# Patient Record
Sex: Male | Born: 1995 | Race: Black or African American | Hispanic: No | Marital: Single | Smoking: Light tobacco smoker
Health system: Southern US, Community
[De-identification: ages and names within clinical notes are randomized; demographics above are authoritative.]

---
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
## 2013-01-26 ENCOUNTER — Emergency Department: Payer: Self-pay | Admitting: Emergency Medicine

## 2013-03-10 ENCOUNTER — Emergency Department: Payer: Self-pay | Admitting: Emergency Medicine

## 2013-09-10 ENCOUNTER — Emergency Department: Payer: Self-pay | Admitting: Emergency Medicine

## 2013-10-24 ENCOUNTER — Inpatient Hospital Stay: Payer: Self-pay | Admitting: Psychiatry

## 2013-10-24 LAB — DRUG SCREEN, URINE
AMPHETAMINES, UR SCREEN: NEGATIVE (ref ?–1000)
Barbiturates, Ur Screen: NEGATIVE (ref ?–200)
Benzodiazepine, Ur Scrn: NEGATIVE (ref ?–200)
CANNABINOID 50 NG, UR ~~LOC~~: POSITIVE (ref ?–50)
Cocaine Metabolite,Ur ~~LOC~~: NEGATIVE (ref ?–300)
MDMA (Ecstasy)Ur Screen: NEGATIVE (ref ?–500)
METHADONE, UR SCREEN: NEGATIVE (ref ?–300)
Opiate, Ur Screen: NEGATIVE (ref ?–300)
Phencyclidine (PCP) Ur S: NEGATIVE (ref ?–25)
TRICYCLIC, UR SCREEN: NEGATIVE (ref ?–1000)

## 2013-10-24 LAB — COMPREHENSIVE METABOLIC PANEL
AST: 30 U/L (ref 10–41)
Albumin: 4.6 g/dL (ref 3.8–5.6)
Alkaline Phosphatase: 76 U/L
Anion Gap: 4 — ABNORMAL LOW (ref 7–16)
BUN: 9 mg/dL (ref 7–18)
Bilirubin,Total: 0.4 mg/dL (ref 0.2–1.0)
Calcium, Total: 8.9 mg/dL — ABNORMAL LOW (ref 9.0–10.7)
Chloride: 104 mmol/L (ref 98–107)
Co2: 28 mmol/L (ref 21–32)
Creatinine: 0.84 mg/dL (ref 0.60–1.30)
EGFR (Non-African Amer.): >60
Glucose: 85 mg/dL (ref 65–99)
OSMOLALITY: 270 (ref 275–301)
POTASSIUM: 3.3 mmol/L — AB (ref 3.5–5.1)
SGPT (ALT): 19 U/L (ref 12–78)
Sodium: 136 mmol/L (ref 136–145)
Total Protein: 7.8 g/dL (ref 6.4–8.6)

## 2013-10-24 LAB — CBC
HCT: 46.2 % (ref 40.0–52.0)
HGB: 14.7 g/dL (ref 13.0–18.0)
MCH: 27 pg (ref 26.0–34.0)
MCHC: 31.9 g/dL — AB (ref 32.0–36.0)
MCV: 85 fL (ref 80–100)
Platelet: 251 10*3/uL (ref 150–440)
RBC: 5.46 10*6/uL (ref 4.40–5.90)
RDW: 13.5 % (ref 11.5–14.5)
WBC: 7.2 10*3/uL (ref 3.8–10.6)

## 2013-10-24 LAB — ETHANOL
Ethanol %: <0.003 % (ref 0.000–0.080)
Ethanol: <3 mg/dL

## 2013-10-24 LAB — URINALYSIS, COMPLETE
Bacteria: NONE SEEN
Bilirubin,UR: NEGATIVE
Blood: NEGATIVE
GLUCOSE, UR: NEGATIVE mg/dL (ref 0–75)
Ketone: NEGATIVE
LEUKOCYTE ESTERASE: NEGATIVE
Nitrite: NEGATIVE
Ph: 7 (ref 4.5–8.0)
Protein: NEGATIVE
RBC,UR: 2 /HPF (ref 0–5)
SPECIFIC GRAVITY: 1.018 (ref 1.003–1.030)
Squamous Epithelial: NONE SEEN
WBC UR: <1 /HPF (ref 0–5)

## 2013-10-24 LAB — ACETAMINOPHEN LEVEL

## 2013-10-24 LAB — SALICYLATE LEVEL

## 2013-12-04 LAB — HM HIV SCREENING LAB: HM HIV Screening: NEGATIVE

## 2014-10-07 ENCOUNTER — Emergency Department: Payer: Self-pay | Admitting: Emergency Medicine

## 2014-11-27 NOTE — H&P (Signed)
PATIENT NAME:  Jerry Rose, Jerry Rose MR#:  [id number] DATE OF BIRTH:  19-Mar-1996  DATE OF ADMISSION:  10/24/2013  PLACE OF DICTATION:  [facility], [place].  AGE:  19 years.  SEX:  Male.  RACE:  African American.  INITIAL PSYCHIATRIC EVALUATION  IDENTIFYING INFORMATION:  The patient is a 19 year old African American male who lives with his cousin who is in his 6750's..  The patient is getting ready to go back to school in the 11th grade as he has been held back and will be going to [employer] and likes it.  The patient comes for first inpatient psychiatry at [facility] with a chief complaint "My mom, she don't care, nobody cares."  HISTORY OF PRESENT ILLNESS:  The patient reports that he and his mother always get into conflicts and arguments and this time he stated, "What would you do if I killed myself and died?"  Mother took it seriously and called for help and he was brought here on [facility].  The patient reports that mother is married and lives with step-father and the patient and step-father do not get along with each other.    PAST PSYCHIATRIC HISTORY:  No previous history of inpatient psychiatry.  No history of suicide attempt, not being followed by any psychiatrist.    FAMILY HISTORY OF MENTAL ILLNESS:  Unknown but no known history of suicide in the family.   FAMILY HISTORY:  Raised by mother and he has not seen his father in many years.  Father left the family when the patient was very young.  Mother works as a Associate Professorcosmetologist.  Producer, television/film/videotep-father cuts hair and the patient does not get along with him.  PERSONAL HISTORY:  [place], [place], was kicked out of school because of trouble with a group of other people.  Currently he likes his [employer] and is making good grades.    WORK:  He has never worked so far.  MILITARY HISTORY:  Nothing applicable.  ALCOHOL AND DRUGS:  Denies drinking alcohol.  Smokes marijuana on a few occasions.   Denies any other street or prescription drug abuse.  Denies smoking nicotine cigarettes.  PAST MEDICAL HISTORY:  No high blood pressure.  No known diabetes mellitus.  Status post tonsillectomy, adenoidectomy - remote.  No major illnesses.  No history of motor vehicle accident or being unconsciousness.  ALLERGIES:  PENICILLIN.  The patient is being followed by Dr. York CeriseForbach, last appointment quite some time ago.  Next appointment is to be made for routine checkup.    PHYSICAL EXAMINATION:  VITAL SIGNS:  Temperature is 98.2, pulse is 64 per minute, regular, respirations 20 per minute, regular.  Blood pressure 122/80 mmHg. HEENT:  Head is normocephalic, atraumatic.  Eyes: PERRLA.  Fundi bilaterally benign. EOMs full and tympanic membranes with no exudates. NECK:  Supple without any lymphadenopathy or thyromegaly. CHEST:  Normal expansion.  Normal breath sounds heard. HEART:  Normal S1 without any murmurs or gallops. ABDOMEN:  Soft, no organomegaly.  Bowel sounds heard.  RECTAL:  Deferred. NEUROLOGIC:  Gait is normal.  Romberg is negative..  Cranial nerves II through XII intact, normal.  DTRs 2+, normal.  Plantars normal.    MENTAL STATUS EXAMINATION:  The patient is dressed in street clothes, alert and oriented.  Fully aware of situation that brought him for admission to [facility].  Affect is appropriate with his mood which is irritable and angry and upset about the entire situation at home and  his arguments which are constant with his mother.  Staff reports that the patient has poor impulse control and wanted to punch holes in the wall and had to be re-directed and calmed down.  No psychosis.  Denies auditory or visual hallucinations.  No paranoid thinking.  Memory is intact.  Cognition is intact.  Could spell the word world forward and backward without any problem.  Could count money.  He did not know the capital of [place], but he knew the capital of the [place] and name of the current  Economist.  Denies any appetite or sleep disturbance.  Insight and judgment guarded.   IMPRESSION:   AXIS I:  Impulse control disorder. AXIS II:  Deferred. AXIS III:  None major. AXIS IV:  Constant, moderate; constant conflicts with mother and step-father, not able to live with them. AXIS V:  Global assessment of functioning 30.  PLAN:  The patient admitted to [facility] for close observation and therapy.  He will be started on antidepressant medication such as Effexor XR 75 mg p.o. daily which will help him calm down and help him with his anxiety and irritability.  During his stay in the hospital he will be given milieu therapy and supportive counseling.  He will take part in individual and group therapy, where coping skills and getting along with the people around will be addressed. At the time of discharge, the patient will be stabilized and appropriate followup appointments will be made in the community and the patient and mother may need some counseling sessions for better understanding of each other.   ____________________________ Jannet Mantis. Guss Bunde, MD skc:ea D: 10/25/2013 00:47:07 ET T: 10/25/2013 02:24:04 ET JOB#: [id number]  cc: Monika Salk K. Guss Bunde, MD, <Dictator> Beau Fanny MD ELECTRONICALLY SIGNED 10/25/2013 23:28

## 2014-11-27 NOTE — Discharge Summary (Signed)
PATIENT NAME:  Jerry Rose, Jerry Rose MR#:  [id number] DATE OF BIRTH:  July 05, 1996  DATE OF ADMISSION:  10/24/2013 DATE OF DISCHARGE:  10/26/2013  HOSPITAL COURSE: See dictated history and physical for details of admission. A 19 year old man brought to the hospital after his mother called EMS because the patient had made a suicidal statement. In the hospital, the patient admitted that he had made such a statement, but denied having any actual suicidal thoughts or plans. He had not made any attempt to harm himself. He has been consistent about not having suicidal thoughts since being in the hospital. The patient was started on venlafaxine by the admitting physician. He engaged in groups and activities on the unit. He met with his family and says that he feels like his relationship with them is improving. At the time of discharge, the patient said his mood is feeling fine. He feels more confident about the future. He is agreeable and receptive to continuing venlafaxine at the current dose. He has positive plans for the future involving going back to school and helping to take care of his young son. At this point, the patient does not seem to be acutely dangerous and can be referred for outpatient treatment in the community.   DISCHARGE MEDICATIONS: Venlafaxine 75 mg extended-release once a day.   MENTAL STATUS EXAM AT DISCHARGE: Neatly dressed and groomed young man who looks his stated age. Cooperative with the interview. Good eye contact. Normal psychomotor activity. Speech normal rate, tone and volume. Affect euthymic, reactive, appropriate. Mood stated as okay. Thoughts lucid. No loosening of associations. Denies auditory or visual hallucinations. Denies any delusions. Short and long-term memory intact. Normal fund of knowledge. Alert and oriented x4.   LABORATORY RESULTS: Labs on admission showed an alcohol level negative. Chemistry panel showed potassium 3.3, calcium 8.9, everything else on the chemistry panel  normal. Drug screen positive for cannabis. CBC and urinalysis unremarkable.   DISPOSITION: Discharge home. He lives with his cousin. He will follow up with outpatient providers as arranged by social work.   DIAGNOSIS, PRINCIPAL AND PRIMARY:  AXIS I: Adjustment disorder with depressed mood.   SECONDARY DIAGNOSES:     AXIS I: No further.  AXIS II: No diagnosis.  AXIS III: No diagnosis.  AXIS IV: Moderate from multiple stresses including being out of work, not having gone back to school, and conflict with his family.  AXIS V: Functioning at time of discharge 60.    ____________________________ Gonzella Lex, MD jtc:by D: 10/26/2013 16:50:40 ET T: 10/26/2013 20:53:10 ET JOB#: [id number]  cc: Gonzella Lex, MD, <Dictator> Gonzella Lex MD ELECTRONICALLY SIGNED 10/26/2013 22:29

## 2015-10-16 ENCOUNTER — Encounter: Payer: Self-pay | Admitting: Emergency Medicine

## 2015-10-16 ENCOUNTER — Emergency Department
Admission: EM | Admit: 2015-10-16 | Discharge: 2015-10-16 | Disposition: A | Discharge status: Home / Self Care | Payer: Self-pay | Attending: Emergency Medicine | Admitting: Emergency Medicine

## 2015-10-16 DIAGNOSIS — L03114 Cellulitis of left upper limb: Secondary | ICD-10-CM | POA: Insufficient documentation

## 2015-10-16 MED ORDER — IBUPROFEN 600 MG PO TABS: 600.0000 mg | ORAL_TABLET | Freq: Three times a day (TID) | ORAL | Status: DC | PRN

## 2015-10-16 MED ORDER — SULFAMETHOXAZOLE-TRIMETHOPRIM 800-160 MG PO TABS: 1.0000 | ORAL_TABLET | Freq: Two times a day (BID) | ORAL | Status: DC

## 2015-10-16 NOTE — ED Notes (Signed)
Pt c/o swelling at site of bug bite on L arm.  Pt c/o itching.  Slight swelling noted at L elbow

## 2015-10-16 NOTE — Discharge Instructions (Signed)
Cellulitis Cellulitis is an infection of the skin and the tissue under the skin. The infected area is usually red and tender. This happens most often in the arms and lower legs. HOME CARE   Take your antibiotic medicine as told. Finish the medicine even if you start to feel better.  Keep the infected arm or leg raised (elevated).  Put a warm cloth on the area up to 4 times per day.  Only take medicines as told by your doctor.  Keep all doctor visits as told. GET HELP IF:  You see red streaks on the skin coming from the infected area.  Your red area gets bigger or turns a dark color.  Your bone or joint under the infected area is painful after the skin heals.  Your infection comes back in the same area or different area.  You have a puffy (swollen) bump in the infected area.  You have new symptoms.  You have a fever. GET HELP RIGHT AWAY IF:   You feel very sleepy.  You throw up (vomit) or have watery poop (diarrhea).  You feel sick and have muscle aches and pains.   This information is not intended to replace advice given to you by your health care provider. Make sure you discuss any questions you have with your health care provider.   Document Released: 01/09/2008 Document Revised: 04/13/2015 Document Reviewed: 10/08/2011 Elsevier Interactive Patient Education 2016 Elsevier Inc[employer]   Apply warm moist compresses to the forearm several times per day. Begin taking antibiotics and start ibuprofen for inflammation and pain. [facility] if any continued problems.

## 2015-10-16 NOTE — ED Provider Notes (Signed)
[facility] Emergency Department Provider Note  ____________________________________________  Time seen: Approximately 5:17 PM  I have reviewed the triage vital signs and the nursing notes.   HISTORY  Chief Complaint Cellulitis   HPI Jerry Rose is a 20 y.o. male is here with complaint of redness and swelling that he noticed on his left forearm yesterday. Patient states that this area has gotten larger today and is more tender. He is unaware of any fever or chills. He is not having any history of MRSA in the past. Patient denies any injury to his arm or any insect bites. He rates his pain as a 6/10.   History reviewed. No pertinent past medical history.  There are no active problems to display for this patient.   History reviewed. No pertinent past surgical history.  Current Outpatient Rx  Name  Route  Sig  Dispense  Refill  . ibuprofen (ADVIL,MOTRIN) 600 MG tablet   Oral   Take 1 tablet (600 mg total) by mouth every 8 (eight) hours as needed.   30 tablet   0   . sulfamethoxazole-trimethoprim (BACTRIM DS,SEPTRA DS) 800-160 MG tablet   Oral   Take 1 tablet by mouth 2 (two) times daily.   20 tablet   0     Allergies Review of patient's allergies indicates no known allergies.  No family history on file.  Social History Social History  Substance Use Topics  . Smoking status: Never Smoker   . Smokeless tobacco: None  . Alcohol Use: No    Review of Systems Constitutional: No fever/chills Cardiovascular: Denies chest pain. Respiratory: Denies shortness of breath. Gastrointestinal:  No nausea, no vomiting.  Skin: Positive for erythema Neurological: Negative for focal weakness or numbness.  10-point ROS otherwise negative.  ____________________________________________   PHYSICAL EXAM:  VITAL SIGNS: ED Triage Vitals  Enc Vitals Group     BP 10/16/15 1654 120/62 mmHg     Pulse Rate 10/16/15 1654 73     Resp 10/16/15 1654 16   Temp 10/16/15 1654 97.5 F (36.4 C)     Temp Source 10/16/15 1654 Oral     SpO2 10/16/15 1654 100 %     Weight 10/16/15 1651 160 lb (72.576 kg)     Height 10/16/15 1651  (1.803 m)     Head Cir --      Peak Flow --      Pain Score 10/16/15 1651 6     Pain Loc --      Pain Edu? --      Excl. in GC? --     Constitutional: Alert and oriented. Well appearing and in no acute distress. Eyes: Conjunctivae are normal. PERRL. EOMI. Head: Atraumatic. Nose: No congestion/rhinnorhea. Neck: No stridor.   Cardiovascular: Normal rate, regular rhythm. Grossly normal heart sounds.  Good peripheral circulation. Respiratory: Normal respiratory effort.  No retractions. Lungs CTAB. Musculoskeletal: Moves upper and lower extremities without any difficulty. Normal gait was noted. Neurologic:  Normal speech and language. No gross focal neurologic deficits are appreciated. No gait instability. Skin:  Skin is warm, dry and intact. There is a erythematous, tender, warm linear 4 cm area on the left forearm lateral aspect. There is no abscess formation seen. Psychiatric: Mood and affect are normal. Speech and behavior are normal.  ____________________________________________   LABS (all labs ordered are listed, but only abnormal results are displayed)  Labs Reviewed - No data to display  PROCEDURES  Procedure(s) performed: None  Critical  Care performed: No  ____________________________________________   INITIAL IMPRESSION / ASSESSMENT AND PLAN / ED COURSE  Pertinent labs & imaging results that were available during my care of the patient were reviewed by me and considered in my medical decision making (see chart for details).  Patient started on Septra DS for 10 days and ibuprofen as needed for pain and inflammation. He is encouraged to use warm compresses to the area frequently and to seek [facility] if any continued problems. ____________________________________________   FINAL  CLINICAL IMPRESSION(S) / ED DIAGNOSES  Final diagnoses:  Cellulitis of left forearm      Tommi RumpsRhonda L Mandi Mattioli, PA-C 10/16/15 1749  Emily FilbertJonathan E Williams, MD 10/16/15 1932

## 2015-10-16 NOTE — ED Notes (Signed)
Patient presents to the ED with redness and swelling to his left arm that he first noticed yesterday.  Area is in the forearm, toward the elbow.

## 2015-12-21 ENCOUNTER — Emergency Department: Payer: Self-pay

## 2015-12-21 ENCOUNTER — Encounter: Payer: Self-pay | Admitting: Emergency Medicine

## 2015-12-21 ENCOUNTER — Emergency Department
Admission: EM | Admit: 2015-12-21 | Discharge: 2015-12-21 | Disposition: A | Discharge status: Home / Self Care | Payer: Self-pay | Attending: Emergency Medicine | Admitting: Emergency Medicine

## 2015-12-21 DIAGNOSIS — M79604 Pain in right leg: Secondary | ICD-10-CM | POA: Insufficient documentation

## 2015-12-21 DIAGNOSIS — R531 Weakness: Secondary | ICD-10-CM | POA: Insufficient documentation

## 2015-12-21 DIAGNOSIS — Z792 Long term (current) use of antibiotics: Secondary | ICD-10-CM | POA: Insufficient documentation

## 2015-12-21 DIAGNOSIS — M545 Low back pain, unspecified: Secondary | ICD-10-CM

## 2015-12-21 DIAGNOSIS — F1290 Cannabis use, unspecified, uncomplicated: Secondary | ICD-10-CM | POA: Insufficient documentation

## 2015-12-21 DIAGNOSIS — R29898 Other symptoms and signs involving the musculoskeletal system: Secondary | ICD-10-CM

## 2015-12-21 DIAGNOSIS — M542 Cervicalgia: Secondary | ICD-10-CM | POA: Insufficient documentation

## 2015-12-21 MED ORDER — NAPROXEN 500 MG PO TABS: 500.0000 mg | ORAL_TABLET | Freq: Two times a day (BID) | ORAL | Status: AC

## 2015-12-21 MED ORDER — KETOROLAC TROMETHAMINE 30 MG/ML IJ SOLN
30.0000 mg | Freq: Once | INTRAMUSCULAR | Status: AC
  Administered 2015-12-21: 30 mg via INTRAVENOUS
  Filled 2015-12-21: qty 1

## 2015-12-21 MED ORDER — DIAZEPAM 5 MG/ML IJ SOLN
5.0000 mg | Freq: Once | INTRAMUSCULAR | Status: AC
  Administered 2015-12-21: 5 mg via INTRAVENOUS

## 2015-12-21 MED ORDER — DIAZEPAM 5 MG/ML IJ SOLN
INTRAMUSCULAR | Status: AC
  Administered 2015-12-21: 5 mg via INTRAVENOUS
  Filled 2015-12-21: qty 2

## 2015-12-21 MED ORDER — CARISOPRODOL 350 MG PO TABS: 350.0000 mg | ORAL_TABLET | Freq: Three times a day (TID) | ORAL | Status: DC | PRN

## 2015-12-21 NOTE — ED Notes (Signed)
Pt arrived from home by EMS with c/o back pain. EMS reports pt fell on a trampoline on Sunday night, landing on his neck. Pt states he is having pain on the right side of neck, radiating down right side of back into the right leg. Pt describes pain as a "charlie horse burning sensation." EMS reports pt was ambulatory on scene.

## 2015-12-21 NOTE — ED Provider Notes (Signed)
[facility] Emergency Department Provider Note  ____________________________________________  Time seen: 3:40 AM  I have reviewed the triage vital signs and the nursing notes.   HISTORY  Chief Complaint Back Pain      HPI Jerry Rose is a 20 y.o. male presents via EMS with complaint of low back pain with radiation and right leg and up to right side of the neck. Patient describes the pain as "Charlie  horse with a burning sensation". Patient states current pain score is 10 out of 10. Of note patient states that he fell on a trampoline on Sunday night landing on his neck awkwardly.     Past medical history  None There are no active problems to display for this patient.   History reviewed. No pertinent past surgical history.  Current Outpatient Rx  Name  Route  Sig  Dispense  Refill  . ibuprofen (ADVIL,MOTRIN) 600 MG tablet   Oral   Take 1 tablet (600 mg total) by mouth every 8 (eight) hours as needed.   30 tablet   0   . sulfamethoxazole-trimethoprim (BACTRIM DS,SEPTRA DS) 800-160 MG tablet   Oral   Take 1 tablet by mouth 2 (two) times daily.   20 tablet   0     Allergies Penicillins and Rocephin  History reviewed. No pertinent family history.  Social History Social History  Substance Use Topics  . Smoking status: Never Smoker   . Smokeless tobacco: None  . Alcohol Use: No    Review of Systems  Constitutional: Negative for fever. Eyes: Negative for visual changes. ENT: Negative for sore throat. Cardiovascular: Negative for chest pain. Respiratory: Negative for shortness of breath. Gastrointestinal: Negative for abdominal pain, vomiting and diarrhea. Genitourinary: Negative for dysuria. Musculoskeletal: Positive for neck and back pain. Skin: Negative for rash. Neurological: Negative for headaches, focal weakness or numbness.   10-point ROS otherwise negative.  ____________________________________________   PHYSICAL  EXAM:  VITAL SIGNS: ED Triage Vitals  Enc Vitals Group     BP 12/21/15 0329 156/98 mmHg     Pulse Rate 12/21/15 0329 78     Resp 12/21/15 0329 14     Temp 12/21/15 0329 98.5 F (36.9 C)     Temp Source 12/21/15 0329 Oral     SpO2 12/21/15 0320 97 %     Weight 12/21/15 0329 150 lb (68.04 kg)     Height 12/21/15 0329 5\' 11"  (1.803 m)     Head Cir --      Peak Flow --      Pain Score 12/21/15 0329 10     Pain Loc --      Pain Edu? --      Excl. in GC? --     Constitutional: Alert and oriented. Well appearing and in no distress. Eyes: Conjunctivae are normal. PERRL. Normal extraocular movements. ENT   Head: Normocephalic and atraumatic.   Nose: No congestion/rhinnorhea.   Mouth/Throat: Mucous membranes are moist.   Neck: No stridor. Hematological/Lymphatic/Immunilogical: No cervical lymphadenopathy. Cardiovascular: Normal rate, regular rhythm. Normal and symmetric distal pulses are present in all extremities. No murmurs, rubs, or gallops. Respiratory: Normal respiratory effort without tachypnea nor retractions. Breath sounds are clear and equal bilaterally. No wheezes/rales/rhonchi. Gastrointestinal: Soft and nontender. No distention. There is no CVA tenderness. Genitourinary: deferred Musculoskeletal: Nontender with normal range of motion in all extremities. No joint effusions.  No lower extremity tenderness nor edema. Neurologic:  Normal speech and language. No gross focal neurologic  deficits are appreciated. Speech is normal.  Skin:  Skin is warm, dry and intact. No rash noted. Psychiatric: Mood and affect are normal. Speech and behavior are normal. Patient exhibits appropriate insight and judgment.  ____________________________________________     RADIOLOGY    MR Cervical Spine Wo Contrast (Final result) Result time: 12/21/15 06:56:10   Procedure changed from MR Cervical Spine W Wo Contrast      Final result by Rad Results In Interface (12/21/15  06:56:10)   Narrative:   CLINICAL DATA: Initial evaluation for acute trauma, recent fall. Right-sided neck pain.  EXAM: MRI CERVICAL SPINE WITHOUT CONTRAST  TECHNIQUE: Multiplanar, multisequence MR imaging of the cervical spine was performed. No intravenous contrast was administered.  COMPARISON: Prior study from 10/07/2014.  FINDINGS: Partially visualized portions of the brain and posterior fossa are within normal limits. Craniocervical junction normal.  Smooth reversal of the normal cervical lordosis with apex at C4-5. No listhesis. Vertebral body heights maintained. No MRI evidence for acute fracture. Signal intensity within the vertebral body bone marrow is normal. No marrow edema.  Signal intensity within the cervical spinal cord is normal.  Mild edema between the spinous processes of C4 and C5 (series 9, image 8). Finding suspicious for possible low-grade ligamentous injury/ strain. No other convincing evidence for ligamentous injury within the cervical spine. No prevertebral edema. Normal intravascular flow voids present within the vertebral arteries bilaterally.  No significant degenerative spondylolysis identified within the cervical spine. No significant canal or foraminal stenosis.  IMPRESSION: 1. Mild soft tissue edema between the spinous processes of C4 and C5, suspicious for possible low grade ligamentous injury/strain involving the interspinous ligaments. 2. Smooth reversal of the normal cervical lordosis, which may be related to the ligamentous injury and/or muscular spasm. 3. No other acute traumatic injury within the cervical spine.   Electronically Signed By: Rise Mu M.D. On: 12/21/2015 06:56        ECG Results        INITIAL IMPRESSION / ASSESSMENT AND PLAN / ED COURSE  Pertinent labs & imaging results that were available during my care of the patient were reviewed by me and considered in my medical decision making  (see chart for details).  Patient received IV Valium 5 mg on presentation to the emergency department with complete resolution of pain on reexamination. Patient's care transferred to Dr. Langston Masker pending MRI of the lumbar spine results  ____________________________________________   FINAL CLINICAL IMPRESSION(S) / ED DIAGNOSES  Final diagnoses:  Low back pain  Right leg pain  Right leg weakness      Darci Current, MD 12/22/15 [phone]

## 2015-12-21 NOTE — ED Notes (Signed)
C Collar applied to pt

## 2015-12-21 NOTE — ED Provider Notes (Addendum)
And off from Dr. Manson PasseyBrown in this 20 year old male who had an injury on a trampoline this past Sunday. Per Dr. Manson PasseyBrown the patient landed awkwardly on a trampoline is been having neck pain as well as right leg pain ever since. Pain is worsened with movement. Pending MRI of the cervical as well as lumbar spine at this time. Physical Exam  BP 119/60 mmHg  Pulse 70  Temp(Src) 98.5 F (36.9 C) (Oral)  Resp 14  Ht 5\' 11"  (1.803 m)  Wt 150 lb (68.04 kg)  BMI 20.93 kg/m2  SpO2 99% ----------------------------------------- 7:13 AM on 12/21/2015 -----------------------------------------   Physical Exam Patient still with spasm in the neck as well as low back after Valium. Intermittently uncomfortable when muscles tightened. Wearing cervical collar which was removed after the MRI results. The patient says he is much more comfortable after the removal of the cervical collar. ED Course  Procedures  MR Lumbar Spine Wo Contrast (Final result) Result time: 12/21/15 07:00:11   Procedure changed from MR Lumbar Spine W Wo Contrast      Final result by Rad Results In Interface (12/21/15 07:00:11)   Narrative:   CLINICAL DATA: Initial evaluation for acute back pain status post recent fall.  EXAM: MRI LUMBAR SPINE WITHOUT CONTRAST  TECHNIQUE: Multiplanar, multisequence MR imaging of the lumbar spine was performed. No intravenous contrast was administered.  COMPARISON: None.  FINDINGS: For the purposes of this dictation, the lowest well-formed intervertebral disc spaces presumed to be the L5-S1 level, and there presumed to be 5 lumbar type vertebral bodies.  Vertebral bodies are normally aligned with preservation of the normal lumbar lordosis. Vertebral body heights are well preserved. No acute fracture or listhesis. Signal intensity within the vertebral body bone marrow is normal. No marrow edema.  Conus medullaris terminates normally at the T12 level. Signal intensity within the  visualized cord is normal. Nerve roots of the cauda equina within normal limits.  Paraspinous soft tissues within normal limits. Visualized visceral structures unremarkable.  No significant degenerative changes identified within the lumbar spine. No disc bulge or disc protrusion. No significant canal or foraminal stenosis.  IMPRESSION: Normal MRI of the lumbar spine. No evidence for acute traumatic injury identified.   Electronically Signed By: Rise MuBenjamin McClintock M.D. On: 12/21/2015 07:00          MR Cervical Spine Wo Contrast (Final result) Result time: 12/21/15 06:56:10   Procedure changed from MR Cervical Spine W Wo Contrast      Final result by Rad Results In Interface (12/21/15 06:56:10)   Narrative:   CLINICAL DATA: Initial evaluation for acute trauma, recent fall. Right-sided neck pain.  EXAM: MRI CERVICAL SPINE WITHOUT CONTRAST  TECHNIQUE: Multiplanar, multisequence MR imaging of the cervical spine was performed. No intravenous contrast was administered.  COMPARISON: Prior study from 10/07/2014.  FINDINGS: Partially visualized portions of the brain and posterior fossa are within normal limits. Craniocervical junction normal.  Smooth reversal of the normal cervical lordosis with apex at C4-5. No listhesis. Vertebral body heights maintained. No MRI evidence for acute fracture. Signal intensity within the vertebral body bone marrow is normal. No marrow edema.  Signal intensity within the cervical spinal cord is normal.  Mild edema between the spinous processes of C4 and C5 (series 9, image 8). Finding suspicious for possible low-grade ligamentous injury/ strain. No other convincing evidence for ligamentous injury within the cervical spine. No prevertebral edema. Normal intravascular flow voids present within the vertebral arteries bilaterally.  No significant degenerative spondylolysis identified  within the cervical spine. No significant  canal or foraminal stenosis.  IMPRESSION: 1. Mild soft tissue edema between the spinous processes of C4 and C5, suspicious for possible low grade ligamentous injury/strain involving the interspinous ligaments. 2. Smooth reversal of the normal cervical lordosis, which may be related to the ligamentous injury and/or muscular spasm. 3. No other acute traumatic injury within the cervical spine.   Electronically Signed By: Rise Mu M.D. On: 12/21/2015 06:56        MDM We'll give patient a dose of Toradol prior to discharge. The patient does not appear to have any nerve or bony injury. Likely cervical strain as well as lumbar strain and muscle spasm causing the symptoms. Explained this to the patient and his family. I will discharge with Naprosyn as well as Soma. I also discussed the use of muscle creams such as icy hot and Aspercreme and the patient will use these at home for continued pain relief as well as a heating pad. Will be discharged home.      Myrna Blazer, MD 12/21/15 [phone]  Patient and family aware of sedating risks of the muscle relaxer.  Myrna Blazer, MD 12/21/15 [phone]

## 2017-03-30 ENCOUNTER — Encounter (HOSPITAL_COMMUNITY): Payer: Self-pay

## 2017-03-30 ENCOUNTER — Emergency Department (HOSPITAL_COMMUNITY): Payer: Self-pay

## 2017-03-30 ENCOUNTER — Emergency Department (HOSPITAL_COMMUNITY)
Admission: EM | Admit: 2017-03-30 | Discharge: 2017-03-30 | Disposition: A | Discharge status: Home / Self Care | Payer: Self-pay | Attending: Emergency Medicine | Admitting: Emergency Medicine

## 2017-03-30 DIAGNOSIS — Y9389 Activity, other specified: Secondary | ICD-10-CM | POA: Insufficient documentation

## 2017-03-30 DIAGNOSIS — M25512 Pain in left shoulder: Secondary | ICD-10-CM | POA: Insufficient documentation

## 2017-03-30 DIAGNOSIS — Y9229 Other specified public building as the place of occurrence of the external cause: Secondary | ICD-10-CM | POA: Insufficient documentation

## 2017-03-30 MED ORDER — IBUPROFEN 600 MG PO TABS: 600.0000 mg | ORAL_TABLET | Freq: Three times a day (TID) | ORAL | 0 refills | Status: DC | PRN

## 2017-03-30 MED ORDER — HYDROMORPHONE HCL 1 MG/ML IJ SOLN
1.0000 mg | Freq: Once | INTRAMUSCULAR | Status: AC
  Administered 2017-03-30: 1 mg via INTRAVENOUS
  Filled 2017-03-30: qty 1
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
## 2017-03-30 NOTE — ED Notes (Signed)
Pt ambulated out of ED without needing assistance and in no distress. He is being driven home by friends.

## 2018-04-27 ENCOUNTER — Emergency Department: Payer: Self-pay

## 2018-04-27 ENCOUNTER — Emergency Department
Admission: EM | Admit: 2018-04-27 | Discharge: 2018-04-27 | Disposition: A | Discharge status: Home / Self Care | Payer: Self-pay | Attending: Emergency Medicine | Admitting: Emergency Medicine

## 2018-04-27 ENCOUNTER — Other Ambulatory Visit: Payer: Self-pay

## 2018-04-27 DIAGNOSIS — S6710XA Crushing injury of unspecified finger(s), initial encounter: Secondary | ICD-10-CM | POA: Insufficient documentation

## 2018-04-27 DIAGNOSIS — Y999 Unspecified external cause status: Secondary | ICD-10-CM | POA: Insufficient documentation

## 2018-04-27 DIAGNOSIS — S60222A Contusion of left hand, initial encounter: Secondary | ICD-10-CM | POA: Insufficient documentation

## 2018-04-27 DIAGNOSIS — Y939 Activity, unspecified: Secondary | ICD-10-CM | POA: Insufficient documentation

## 2018-04-27 DIAGNOSIS — Y929 Unspecified place or not applicable: Secondary | ICD-10-CM | POA: Insufficient documentation

## 2018-04-27 DIAGNOSIS — W500XXA Accidental hit or strike by another person, initial encounter: Secondary | ICD-10-CM | POA: Insufficient documentation

## 2018-04-27 MED ORDER — NAPROXEN 500 MG PO TABS: 500.0000 mg | ORAL_TABLET | Freq: Two times a day (BID) | ORAL | 0 refills | Status: DC

## 2018-04-27 NOTE — Discharge Instructions (Signed)
Ice 20 minutes per hour while awake. Take the naprosyn 2 times per day if needed. Follow up with primary care provider above if not improving over the week. Return to the ER for symptoms that change or worsen.
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
## 2018-07-26 ENCOUNTER — Emergency Department
Admission: EM | Admit: 2018-07-26 | Discharge: 2018-07-26 | Disposition: A | Discharge status: Home / Self Care | Payer: Self-pay | Attending: Emergency Medicine | Admitting: Emergency Medicine

## 2018-07-26 ENCOUNTER — Other Ambulatory Visit: Payer: Self-pay

## 2018-07-26 ENCOUNTER — Encounter: Payer: Self-pay | Admitting: Emergency Medicine

## 2018-07-26 ENCOUNTER — Emergency Department: Payer: Self-pay

## 2018-07-26 DIAGNOSIS — Y998 Other external cause status: Secondary | ICD-10-CM | POA: Insufficient documentation

## 2018-07-26 DIAGNOSIS — S60222A Contusion of left hand, initial encounter: Secondary | ICD-10-CM | POA: Insufficient documentation

## 2018-07-26 DIAGNOSIS — Z23 Encounter for immunization: Secondary | ICD-10-CM | POA: Insufficient documentation

## 2018-07-26 DIAGNOSIS — Z79899 Other long term (current) drug therapy: Secondary | ICD-10-CM | POA: Insufficient documentation

## 2018-07-26 DIAGNOSIS — S60473A Other superficial bite of left middle finger, initial encounter: Secondary | ICD-10-CM | POA: Insufficient documentation

## 2018-07-26 DIAGNOSIS — T148XXA Other injury of unspecified body region, initial encounter: Secondary | ICD-10-CM

## 2018-07-26 DIAGNOSIS — Y929 Unspecified place or not applicable: Secondary | ICD-10-CM | POA: Insufficient documentation

## 2018-07-26 DIAGNOSIS — Y9389 Activity, other specified: Secondary | ICD-10-CM | POA: Insufficient documentation

## 2018-07-26 MED ORDER — CIPROFLOXACIN HCL 500 MG PO TABS: 500.0000 mg | ORAL_TABLET | Freq: Once | ORAL | Status: DC

## 2018-07-26 MED ORDER — CLINDAMYCIN HCL 150 MG PO CAPS
300.0000 mg | ORAL_CAPSULE | ORAL | Status: AC
  Administered 2018-07-26: 300 mg via ORAL
  Filled 2018-07-26: qty 2

## 2018-07-26 MED ORDER — DOXYCYCLINE HYCLATE 100 MG PO CAPS: 100.0000 mg | ORAL_CAPSULE | Freq: Two times a day (BID) | ORAL | 0 refills | Status: AC

## 2018-07-26 MED ORDER — TETANUS-DIPHTH-ACELL PERTUSSIS 5-2.5-18.5 LF-MCG/0.5 IM SUSP
0.5000 mL | Freq: Once | INTRAMUSCULAR | Status: AC
  Administered 2018-07-26: 0.5 mL via INTRAMUSCULAR
  Filled 2018-07-26: qty 0.5

## 2018-07-26 MED ORDER — DOXYCYCLINE HYCLATE 100 MG PO TABS
100.0000 mg | ORAL_TABLET | Freq: Once | ORAL | Status: AC
  Administered 2018-07-26: 100 mg via ORAL
  Filled 2018-07-26: qty 1

## 2018-07-26 MED ORDER — CLINDAMYCIN HCL 300 MG PO CAPS: 300.0000 mg | ORAL_CAPSULE | Freq: Three times a day (TID) | ORAL | 0 refills | Status: AC

## 2018-07-26 MED ORDER — BACITRACIN ZINC 500 UNIT/GM EX OINT
TOPICAL_OINTMENT | CUTANEOUS | Status: AC
  Administered 2018-07-26: 1 via TOPICAL
  Filled 2018-07-26: qty 1.8

## 2018-07-26 NOTE — ED Notes (Signed)
Pt given ice for hand.  
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
## 2018-07-26 NOTE — ED Notes (Signed)
Patient transported to X-ray 

## 2019-02-17 ENCOUNTER — Encounter (HOSPITAL_COMMUNITY): Payer: Self-pay | Admitting: Emergency Medicine

## 2019-02-17 ENCOUNTER — Emergency Department (HOSPITAL_COMMUNITY)
Admission: EM | Admit: 2019-02-17 | Discharge: 2019-02-17 | Disposition: A | Discharge status: Home / Self Care | Payer: No Typology Code available for payment source | Attending: Emergency Medicine | Admitting: Emergency Medicine

## 2019-02-17 ENCOUNTER — Emergency Department (HOSPITAL_COMMUNITY): Payer: No Typology Code available for payment source

## 2019-02-17 ENCOUNTER — Other Ambulatory Visit: Payer: Self-pay

## 2019-02-17 DIAGNOSIS — Y9389 Activity, other specified: Secondary | ICD-10-CM | POA: Diagnosis not present

## 2019-02-17 DIAGNOSIS — S46912A Strain of unspecified muscle, fascia and tendon at shoulder and upper arm level, left arm, initial encounter: Secondary | ICD-10-CM | POA: Diagnosis not present

## 2019-02-17 DIAGNOSIS — X500XXA Overexertion from strenuous movement or load, initial encounter: Secondary | ICD-10-CM | POA: Insufficient documentation

## 2019-02-17 DIAGNOSIS — S4992XA Unspecified injury of left shoulder and upper arm, initial encounter: Secondary | ICD-10-CM | POA: Diagnosis present

## 2019-02-17 DIAGNOSIS — Y9289 Other specified places as the place of occurrence of the external cause: Secondary | ICD-10-CM | POA: Diagnosis not present

## 2019-02-17 DIAGNOSIS — Y990 Civilian activity done for income or pay: Secondary | ICD-10-CM | POA: Insufficient documentation

## 2019-02-17 MED ORDER — IBUPROFEN 800 MG PO TABS
800.0000 mg | ORAL_TABLET | Freq: Once | ORAL | Status: AC
  Administered 2019-02-17: 800 mg via ORAL
  Filled 2019-02-17: qty 1

## 2019-02-17 MED ORDER — IBUPROFEN 800 MG PO TABS: 800.0000 mg | ORAL_TABLET | Freq: Three times a day (TID) | ORAL | 0 refills | Status: DC

## 2019-02-17 NOTE — ED Triage Notes (Signed)
Pt reports shoulder injury to left shoulder and has prior hx of dislocation.

## 2019-02-17 NOTE — ED Provider Notes (Signed)
[facility]-EMERGENCY DEPT Provider Note   CSN: [id number] Arrival date & time: 02/17/19  0249     History   Chief Complaint Chief Complaint  Patient presents with  . Shoulder Injury    HPI Jerry Rose is a 23 y.o. male.     Patient presents to the emergency department with a chief complaint of left shoulder pain.  He states that he was throwing something at work today and felt the stretch in his left shoulder.  He complains of pain with movement of his shoulder now.  He also complains of some tingling in his hand.  Denies taking anything for symptoms.  States he has dislocated his shoulder before.  He is able to move his shoulder and arm, but with pain.  The history is provided by the patient. No language interpreter was used.    History reviewed. No pertinent past medical history.  There are no active problems to display for this patient.   History reviewed. No pertinent surgical history.      Home Medications    Prior to Admission medications   Medication Sig Start Date End Date Taking? Authorizing Provider  ibuprofen (ADVIL) 800 MG tablet Take 1 tablet (800 mg total) by mouth 3 (three) times daily. 02/17/19   Roxy HorsemanBrowning, Lasundra Hascall, PA-C    Family History History reviewed. No pertinent family history.  Social History Social History   Tobacco Use  . Smoking status: Never Smoker  . Smokeless tobacco: Never Used  Substance Use Topics  . Alcohol use: Yes  . Drug use: Yes    Types: Marijuana     Allergies   Penicillins and Rocephin [ceftriaxone sodium in dextrose]   Review of Systems Review of Systems  All other systems reviewed and are negative.    Physical Exam Updated Vital Signs BP 124/83 (BP Location: Right Arm)   Pulse 60   Temp 98 F (36.7 C) (Oral)   Resp 16   Ht 5\' 10"  (1.778 m)   Wt 68 kg   SpO2 98%   BMI 21.52 kg/m   Physical Exam  Nursing note and vitals reviewed.  Constitutional: Pt appears well-developed  and well-nourished. No distress.  HENT:  Head: Normocephalic and atraumatic.  Eyes: Conjunctivae are normal.  Neck: Normal range of motion.  Cardiovascular: Normal rate, regular rhythm. Intact distal pulses.   Capillary refill < 3 sec.  Pulmonary/Chest: Effort normal and breath sounds normal.  Musculoskeletal:  LUE Pt exhibits TTP over anterior shoulder, no bony deformity.   ROM: 4/5 limited by pain  Strength: 4/5 limited by pain  Neurological: Pt  is alert. Coordination normal.  Sensation: 5/5 Skin: Skin is warm and dry. Pt is not diaphoretic.  No evidence of open wound or skin tenting Psychiatric: Pt has a normal mood and affect.    ED Treatments / Results  Labs (all labs ordered are listed, but only abnormal results are displayed) Labs Reviewed - No data to display  EKG None  Radiology Dg Shoulder Left  Result Date: 02/17/2019 CLINICAL DATA:  Shoulder injury. EXAM: LEFT SHOULDER - 2+ VIEW COMPARISON:  03/30/2017 FINDINGS: There is no evidence of fracture or dislocation. There is no evidence of arthropathy or other focal bone abnormality. Soft tissues are unremarkable. IMPRESSION: Negative. Electronically Signed   By: Marnee SpringJonathon  Watts M.D.   On: 02/17/2019 04:21    Procedures Procedures (including critical care time)  Medications Ordered in ED Medications  ibuprofen (ADVIL) tablet 800 mg (has no administration  in time range)     Initial Impression / Assessment and Plan / ED Course  I have reviewed the triage vital signs and the nursing notes.  Pertinent labs & imaging results that were available during my care of the patient were reviewed by me and considered in my medical decision making (see chart for details).        Patient presents with injury to left shoulder.  DDx includes, fracture, strain, or sprain.  Consultants: none  Plain films reveal negative.  Pt advised to follow up with PCP and/or orthopedics. Patient given sling while in ED, conservative  therapy such as RICE recommended and discussed.   Patient will be discharged home & is agreeable with above plan. Returns precautions discussed. Pt appears safe for discharge.   Final Clinical Impressions(s) / ED Diagnoses   Final diagnoses:  Strain of left shoulder, initial encounter    ED Discharge Orders         Ordered    ibuprofen (ADVIL) 800 MG tablet  3 times daily     02/17/19 0454           Montine Circle, PA-C 02/17/19 0456    Palumbo, April, MD 02/17/19 8676

## 2019-05-05 ENCOUNTER — Other Ambulatory Visit: Payer: Self-pay

## 2019-05-05 ENCOUNTER — Encounter: Payer: Self-pay | Admitting: Family Medicine

## 2019-05-05 ENCOUNTER — Ambulatory Visit: Payer: Self-pay | Admitting: Family Medicine

## 2019-05-05 DIAGNOSIS — Z113 Encounter for screening for infections with a predominantly sexual mode of transmission: Secondary | ICD-10-CM

## 2019-05-05 DIAGNOSIS — Z202 Contact with and (suspected) exposure to infections with a predominantly sexual mode of transmission: Secondary | ICD-10-CM

## 2019-05-05 MED ORDER — GENTAMICIN SULFATE 40 MG/ML IJ SOLN
240.0000 mg | Freq: Once | INTRAMUSCULAR | Status: AC
  Administered 2019-05-05: 12:00:00 240 mg via INTRAMUSCULAR

## 2019-05-05 MED ORDER — AZITHROMYCIN 500 MG PO TABS
2000.0000 mg | ORAL_TABLET | Freq: Once | ORAL | Status: AC
  Administered 2019-05-05: 2000 mg via ORAL

## 2019-05-05 NOTE — Progress Notes (Signed)
    STI clinic/screening visit  Subjective:  Jerry Rose is a 23 y.o. male being seen today for an STI screening visit. The patient reports they do have symptoms.  Patient has the following medical conditions:  There are no active problems to display for this patient.    Chief Complaint  Patient presents with  . SEXUALLY TRANSMITTED DISEASE    contact to Chlamydia and GC    HPI  Patient reports here for contact to [facility] and chlamydia.  States that he has discharge since 05/01/19.  States his partner has been treated.  See flowsheet for further details and programmatic requirements.    The following portions of the patient's history were reviewed and updated as appropriate: allergies, current medications, past medical history, past social history, past surgical history and problem list.  Objective:  There were no vitals filed for this visit.  Physical Exam  Client declines exam and bloodwork/testing    Assessment and Plan:  Jerry Rose is a 23 y.o. male presenting to the [facility] Department for STI screening  1. Screening examination for venereal disease   2. Exposure to STD  - gentamicin (GARAMYCIN) injection 240 mg - azithromycin (ZITHROMAX) tablet 2,000 mg Co. Not to be sexually active x 1 wk. Co. To use condoms always.    No follow-ups on file.  No future appointments.  Hassell Done, FNP

## 2019-05-05 NOTE — Progress Notes (Signed)
Patient received treatment for contact to [facility] and Chlamydia per Hassell Done, FNP written and verbal order with Gentamicin IM and Azithromycin. Patient tolerated well. Provider orders completed.Ronny Bacon, RN

## 2019-05-05 NOTE — Progress Notes (Signed)
Patient here as a contact STD; reports he thinks it was both Chlamydia and GC. Patient declines blood work today and reports he only wants treatment.Ronny Bacon, RN

## 2019-07-29 ENCOUNTER — Ambulatory Visit: Payer: Self-pay | Admitting: Physician Assistant

## 2019-07-29 ENCOUNTER — Other Ambulatory Visit: Payer: Self-pay

## 2019-07-29 ENCOUNTER — Encounter: Payer: Self-pay | Admitting: Physician Assistant

## 2019-07-29 DIAGNOSIS — Z113 Encounter for screening for infections with a predominantly sexual mode of transmission: Secondary | ICD-10-CM

## 2019-07-29 DIAGNOSIS — Z792 Long term (current) use of antibiotics: Secondary | ICD-10-CM

## 2019-07-29 DIAGNOSIS — Z202 Contact with and (suspected) exposure to infections with a predominantly sexual mode of transmission: Secondary | ICD-10-CM

## 2019-07-29 MED ORDER — AZITHROMYCIN 500 MG PO TABS
2000.0000 mg | ORAL_TABLET | Freq: Once | ORAL | Status: AC
  Administered 2019-07-29: 2000 mg via ORAL

## 2019-07-29 MED ORDER — GENTAMICIN SULFATE 40 MG/ML IJ SOLN
240.0000 mg | Freq: Once | INTRAMUSCULAR | Status: AC
  Administered 2019-07-29: 240 mg via INTRAMUSCULAR

## 2019-07-29 MED ORDER — METRONIDAZOLE 500 MG PO TABS: 2000.0000 mg | ORAL_TABLET | Freq: Once | ORAL | 0 refills | Status: AC

## 2019-07-29 MED ORDER — GENTAMICIN SULFATE 40 MG/ML IJ SOLN: 80.0000 mg | Freq: Once | INTRAMUSCULAR | Status: DC

## 2019-07-29 NOTE — Progress Notes (Signed)
   [facility] Department STI clinic/screening visit  Subjective:  Jerry Rose is a 23 y.o. male being seen today for an STI screening visit. The patient reports they do have symptoms.    Patient has the following medical conditions:  There are no problems to display for this patient.    Chief Complaint  Patient presents with  . SEXUALLY TRANSMITTED DISEASE    HPI  Patient reports that he is having yellow/green discharge for the last few days.  States that after his last visit, he an partner broke up but they recently go back together and he started having symptoms again.  Reports that when he started having symptoms, he found out that his partner had never been treated as a contact to him when he had GC and that she had been dx with Trich.  Patient declines screening exam and blood work today and requests re treatment for GC and as a contact to [place] only.   See flowsheet for further details and programmatic requirements.    The following portions of the patient's history were reviewed and updated as appropriate: allergies, current medications, past medical history, past social history, past surgical history and problem list.  Objective:  There were no vitals filed for this visit.  Physical Exam Constitutional:      General: He is not in acute distress.    Appearance: Normal appearance. He is normal weight.  HENT:     Head: Normocephalic and atraumatic.     Comments: No nits, lice, or hair loss.  Eyes:     Conjunctiva/sclera: Conjunctivae normal.  Pulmonary:     Effort: Pulmonary effort is normal.  Skin:    General: Skin is warm and dry.  Neurological:     Mental Status: He is alert and oriented to person, place, and time.  Psychiatric:        Mood and Affect: Mood normal.        Behavior: Behavior normal.        Thought Content: Thought content normal.        Judgment: Judgment normal.       Assessment and Plan:  COBIN CADAVID is a 23 y.o. male  presenting to the [facility] Department for STI screening  1. Screening for STD (sexually transmitted disease) Patient into clinic with symptoms.  Declines screening exam and blood work today. Rec condoms with all sex.  2. Venereal disease contact Will treat as a contact to Trich with Metronidazole 2g po at one time with food, no EtOH for 24 hr before and until 72 hr after taking medicine. No sex for 7 days and until after partner completes treatment. Rec that patient wait at least 24 hr before taking Metronidazole due to 2 g dose of Azithromycin given already today to prevent nausea, vomiting and diarrhea. - metroNIDAZOLE (FLAGYL) 500 MG tablet; Take 4 tablets (2,000 mg total) by mouth once for 1 dose.  Dispense: 4 tablet; Refill: 0  3. Prophylactic antibiotic Will treat for GC due to re-exposure to untreated partner with Gentamicin 240mg  IM and Azithromycin 2 g po DOT today. No sex for 7 days and until after partner completes treatment. RTC for re-treatment if vomits < 2 hr after completing medicine. - azithromycin (ZITHROMAX) tablet 2,000 mg - gentamicin (GARAMYCIN) injection 240 mg     No follow-ups on file.  No future appointments.  Jerene Dilling, PA

## 2019-09-24 ENCOUNTER — Other Ambulatory Visit: Payer: Self-pay

## 2019-09-24 ENCOUNTER — Encounter: Payer: Self-pay | Admitting: Physician Assistant

## 2019-09-24 ENCOUNTER — Ambulatory Visit: Payer: Self-pay | Admitting: Physician Assistant

## 2019-09-24 DIAGNOSIS — Z202 Contact with and (suspected) exposure to infections with a predominantly sexual mode of transmission: Secondary | ICD-10-CM

## 2019-09-24 DIAGNOSIS — Z113 Encounter for screening for infections with a predominantly sexual mode of transmission: Secondary | ICD-10-CM

## 2019-09-24 MED ORDER — AZITHROMYCIN 500 MG PO TABS
2000.0000 mg | ORAL_TABLET | Freq: Once | ORAL | Status: AC
  Administered 2019-09-24: 2000 mg via ORAL

## 2019-09-24 MED ORDER — GENTAMICIN SULFATE 40 MG/ML IJ SOLN
240.0000 mg | Freq: Once | INTRAMUSCULAR | Status: AC
  Administered 2019-09-24: 240 mg via INTRAMUSCULAR

## 2019-09-24 NOTE — Progress Notes (Signed)
Here today for treatment as a contact to Chlamydia. Declines all screening. Tawny Hopping, RN

## 2019-09-24 NOTE — Progress Notes (Addendum)
Patient treated per provider orders as a  contact to Gonorrhea and Chlamydia. Tolerated well.  Tawny Hopping, RN

## 2019-09-27 NOTE — Progress Notes (Signed)
   [facility] Department STI clinic/screening visit  Subjective:  Jerry Rose is a 24 y.o. male being seen today for an STI screening visit. The patient reports they do have symptoms.    Patient has the following medical conditions:  There are no problems to display for this patient.    Chief Complaint  Patient presents with  . Exposure to STD    HPI  Patient reports that he is a contact to GC and Chlamydia.  States that he has had a yellow discharge and dysuria for 2 days.  Declines any blood work and screening tests today and requests treatment only.   See flowsheet for further details and programmatic requirements.    The following portions of the patient's history were reviewed and updated as appropriate: allergies, current medications, past medical history, past social history, past surgical history and problem list.  Objective:  There were no vitals filed for this visit.  Physical Exam Constitutional:      General: He is not in acute distress.    Appearance: Normal appearance. He is normal weight.  HENT:     Head: Normocephalic and atraumatic.  Pulmonary:     Effort: Pulmonary effort is normal.  Neurological:     Mental Status: He is alert and oriented to person, place, and time.  Psychiatric:        Mood and Affect: Mood normal.        Behavior: Behavior normal.        Thought Content: Thought content normal.        Judgment: Judgment normal.       Assessment and Plan:  Jerry Rose is a 24 y.o. male presenting to the [facility] Department for STI screening  1. Screening for STD (sexually transmitted disease) Patient into clinic with symptoms.  Declines exam, testing and blood work. Rec condoms with all sex. Requests treatment as a contact to GC and Chlamydia only.  2. Venereal disease contact Treat as a contact to GC and Chlamydia with Gentamicin 240mg  IM and Azithromycin 2 g po DOT. No sex for 7 days and until after  partner completes treatment. RTC for re-treatment if vomits < 2 hr after taking medicine. - gentamicin (GARAMYCIN) injection 240 mg - azithromycin (ZITHROMAX) tablet 2,000 mg     No follow-ups on file.  No future appointments.  , PA

## 2019-10-04 NOTE — Progress Notes (Signed)
Chart reviewed by Pharmacist  Suzanne Walker PharmD, Contract Pharmacist at [facility]  

## 2019-11-12 ENCOUNTER — Emergency Department (HOSPITAL_COMMUNITY)
Admission: EM | Admit: 2019-11-12 | Discharge: 2019-11-12 | Disposition: A | Discharge status: Home / Self Care | Payer: Self-pay | Attending: Emergency Medicine | Admitting: Emergency Medicine

## 2019-11-12 ENCOUNTER — Other Ambulatory Visit: Payer: Self-pay

## 2019-11-12 DIAGNOSIS — F10929 Alcohol use, unspecified with intoxication, unspecified: Secondary | ICD-10-CM | POA: Insufficient documentation

## 2019-11-12 DIAGNOSIS — R112 Nausea with vomiting, unspecified: Secondary | ICD-10-CM | POA: Insufficient documentation

## 2019-11-12 DIAGNOSIS — F10920 Alcohol use, unspecified with intoxication, uncomplicated: Secondary | ICD-10-CM

## 2019-11-12 DIAGNOSIS — F17200 Nicotine dependence, unspecified, uncomplicated: Secondary | ICD-10-CM | POA: Insufficient documentation

## 2019-11-12 LAB — CBC
HCT: 47.5 % (ref 39.0–52.0)
Hemoglobin: 15.1 g/dL (ref 13.0–17.0)
MCH: 27.4 pg (ref 26.0–34.0)
MCHC: 31.8 g/dL (ref 30.0–36.0)
MCV: 86.2 fL (ref 80.0–100.0)
Platelets: 235 10*3/uL (ref 150–400)
RBC: 5.51 MIL/uL (ref 4.22–5.81)
RDW: 12.9 % (ref 11.5–15.5)
WBC: 7.8 10*3/uL (ref 4.0–10.5)
nRBC: 0 % (ref 0.0–0.2)

## 2019-11-12 LAB — COMPREHENSIVE METABOLIC PANEL
ALT: 17 U/L (ref 0–44)
AST: 32 U/L (ref 15–41)
Albumin: 5 g/dL (ref 3.5–5.0)
Alkaline Phosphatase: 54 U/L (ref 38–126)
Anion gap: 13 (ref 5–15)
BUN: 15 mg/dL (ref 6–20)
CO2: 25 mmol/L (ref 22–32)
Calcium: 9.4 mg/dL (ref 8.9–10.3)
Chloride: 103 mmol/L (ref 98–111)
Creatinine, Ser: 0.98 mg/dL (ref 0.61–1.24)
GFR calc Af Amer: >60 mL/min (ref >60)
GFR calc non Af Amer: >60 mL/min (ref >60)
Glucose, Bld: 90 mg/dL (ref 70–99)
Potassium: 3.8 mmol/L (ref 3.5–5.1)
Sodium: 141 mmol/L (ref 135–145)
Total Bilirubin: 0.8 mg/dL (ref 0.3–1.2)
Total Protein: 7.8 g/dL (ref 6.5–8.1)

## 2019-11-12 LAB — RAPID URINE DRUG SCREEN, HOSP PERFORMED
Amphetamines: NOT DETECTED
Barbiturates: NOT DETECTED
Benzodiazepines: POSITIVE — AB
Cocaine: NOT DETECTED
Opiates: NOT DETECTED
Tetrahydrocannabinol: POSITIVE — AB

## 2019-11-12 LAB — URINALYSIS, ROUTINE W REFLEX MICROSCOPIC
Bilirubin Urine: NEGATIVE
Glucose, UA: NEGATIVE mg/dL
Hgb urine dipstick: NEGATIVE
Ketones, ur: 5 mg/dL — AB
Leukocytes,Ua: NEGATIVE
Nitrite: NEGATIVE
Protein, ur: NEGATIVE mg/dL
Specific Gravity, Urine: 1.021 (ref 1.005–1.030)
pH: 6 (ref 5.0–8.0)

## 2019-11-12 LAB — ETHANOL: Alcohol, Ethyl (B): 87 mg/dL — ABNORMAL HIGH (ref <10)

## 2019-11-12 MED ORDER — ONDANSETRON HCL 4 MG/2ML IJ SOLN
4.0000 mg | Freq: Once | INTRAMUSCULAR | Status: AC
  Administered 2019-11-12: 06:00:00 4 mg via INTRAVENOUS
  Filled 2019-11-12: qty 2

## 2019-11-12 MED ORDER — SODIUM CHLORIDE 0.9 % IV BOLUS
1000.0000 mL | Freq: Once | INTRAVENOUS | Status: AC
  Administered 2019-11-12: 1000 mL via INTRAVENOUS

## 2019-11-12 NOTE — ED Notes (Signed)
Patient aware urine sample is needed. Urinal at bedside. 

## 2019-11-12 NOTE — ED Notes (Signed)
PATIENT AMBULATED AROUND EMERGENCY ROOM HE IS STEADY ON HIS FEET LOC X'S 4

## 2019-11-12 NOTE — ED Notes (Signed)
Discharge paperwork reviewed with pt.  Pt verbalized understanding, ambulatory with steady gait out of dept. Mom is here with pt for transportation home.
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
## 2019-11-12 NOTE — ED Provider Notes (Signed)
[facility] Note   CSN: [id number] Arrival date & time: 11/12/19  0358     History No chief complaint on file.   Jerry Rose is a 24 y.o. male with no significant past medical history who presents to the emergency department by EMS with a chief complaint of alcohol intoxication.  EMS reports that the patient drank at least a pint and a half of liquor by himself.  He was bashing his head on the floor.  He was oriented only to self when EMS arrived.  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  He was having nausea and vomiting.  He was combative and spitting on people.  He was given 5 mg of midazolam in route.  C-collar placed for airway protection.  On my evaluation, patient is drowsy, but arousable.  He is alert and oriented x4.  He is unsure how much alcohol he consumed, but states that "it was a lot" He also endorses many episodes of nonbloody, nonbilious vomiting.  States he does recall hitting his head on the floor.  No syncope.  He denies headache, neck pain, facial or dental pain, visual changes, numbness, or weakness.  The history is provided by the patient. No language interpreter was used.       No past medical history on file.  There are no problems to display for this patient.   No past surgical history on file.     No family history on file.  Social History   Tobacco Use  . Smoking status: Light Tobacco Smoker  . Smokeless tobacco: Never Used  Substance Use Topics  . Alcohol use: Yes  . Drug use: Yes    Types: Marijuana    Home Medications Prior to Admission medications   Medication Sig Start Date End Date Taking? Authorizing Provider  ibuprofen (ADVIL) 800 MG tablet Take 1 tablet (800 mg total) by mouth 3 (three) times daily. 02/17/19   Roxy Horseman, PA-C    Allergies    Penicillins and Rocephin [ceftriaxone sodium in dextrose]  Review of Systems   Review of Systems   Constitutional: Negative for appetite change, chills and fever.  HENT: Negative for congestion, sneezing and sore throat.   Eyes: Negative for visual disturbance.  Respiratory: Negative for cough, shortness of breath and wheezing.   Cardiovascular: Negative for chest pain, palpitations and leg swelling.  Gastrointestinal: Positive for nausea and vomiting. Negative for abdominal pain, blood in stool and diarrhea.  Genitourinary: Negative for dysuria.  Musculoskeletal: Negative for back pain, neck pain and neck stiffness.  Skin: Negative for rash.  Allergic/Immunologic: Negative for immunocompromised state.  Neurological: Negative for syncope, weakness and headaches.  Psychiatric/Behavioral: Positive for behavioral problems. Negative for confusion.    Physical Exam Updated Vital Signs BP (!) 110/59   Pulse 71   Temp (!) 97.5 F (36.4 C) (Oral)   Resp 15   SpO2 97%   Physical Exam Vitals and nursing note reviewed.  Constitutional:      Appearance: He is well-developed.     Comments: Sleeping on arrival.  Opens his eyes to voice.  HENT:     Head: Normocephalic and atraumatic.     Jaw: There is normal jaw occlusion. No tenderness or pain on movement.     Mouth/Throat:     Dentition: No dental tenderness.     Pharynx: Oropharynx is clear. Uvula midline. No pharyngeal swelling, oropharyngeal exudate, posterior oropharyngeal erythema  or uvula swelling.  Eyes:     Conjunctiva/sclera: Conjunctivae normal.     Comments: Bilateral eyes are injected.  Cardiovascular:     Rate and Rhythm: Normal rate and regular rhythm.     Heart sounds: No murmur.  Pulmonary:     Effort: Pulmonary effort is normal. No respiratory distress.     Breath sounds: No stridor. No wheezing, rhonchi or rales.     Comments: Patient speaks in complete, fluent sentences. Chest:     Chest wall: No tenderness.  Abdominal:     General: There is no distension.     Palpations: Abdomen is soft.  Musculoskeletal:      Cervical back: Normal range of motion and neck supple. No rigidity or tenderness.     Right lower leg: No edema.     Left lower leg: No edema.  Skin:    General: Skin is warm and dry.     Capillary Refill: Capillary refill takes less than 2 seconds.     Coloration: Skin is not jaundiced.     Findings: No erythema.  Neurological:     Mental Status: He is alert.     Comments: Alert and oriented x4.  Moves all 4 extremities spontaneously.  GCS 14.  Sensation to the upper and lower extremities is intact.  Cranial nerves II through XII are grossly intact.  Psychiatric:        Behavior: Behavior normal.     ED Results / Procedures / Treatments   Labs (all labs ordered are listed, but only abnormal results are displayed) Labs Reviewed  ETHANOL - Abnormal; Notable for the following components:      Result Value   Alcohol, Ethyl (B) 87 (*)    All other components within normal limits  CBC  COMPREHENSIVE METABOLIC PANEL  RAPID URINE DRUG SCREEN, HOSP PERFORMED  URINALYSIS, ROUTINE W REFLEX MICROSCOPIC    EKG None  Radiology No results found.  Procedures Procedures (including critical care time)  Medications Ordered in ED Medications  sodium chloride 0.9 % bolus 1,000 mL (1,000 mLs Intravenous New Bag/Given 11/12/19 0608)  ondansetron (ZOFRAN) injection 4 mg (4 mg Intravenous Given 11/12/19 9562)    ED Course  I have reviewed the triage vital signs and the nursing notes.  Pertinent labs & imaging results that were available during my care of the patient were reviewed by me and considered in my medical decision making (see chart for details).    MDM Rules/Calculators/A&P                      24 year old male with pertinent past medical history presenting by EMS with alcohol intoxication.  Patient began banging his head on the floor while he was at a party and was having copious amounts of vomiting accompanied by nausea.  He became combative and was spitting on other  individuals at the party.  He was given midazolam in route with EMS.  Oriented to self on EMS arrival.  On my evaluation, he is somnolent, but arouses to voice.  GCS 14.  He is now alert and oriented x4.  He is protecting his airway.  C-collar was in place, but he has no midline tenderness of the neck.  Full active and passive range of motion without pain.  C-collar removed by me.  Head is atraumatic.  Maxillofacial exam is unremarkable.  Given that he has no complaints, will defer imaging of the head at this time.  No electrolyte  derangements.  CBC is unremarkable.  Ethanol level is 87.  UDS is pending.  Patient denies taking any other illicit or recreational substances.  The patient's mother is at bedside.  Most the patient has been fluid challenged and is able to ambulate and has not further metabolize to freedom that he can be discharged home under the care of his mother.  Patient care transferred to [facility] at the end of my shift to follow-up on UDS and clinical improvement of the patient. Patient presentation, ED course, and plan of care discussed with review of all pertinent labs and imaging. Please see his/her note for further details regarding further ED course and disposition.  Final Clinical Impression(s) / ED Diagnoses Final diagnoses:  None    Rx / DC Orders ED Discharge Orders    None       Marielouise Amey A, PA-C 11/12/19 0723    Derwood Kaplan, MD 11/18/19 [phone]

## 2019-11-12 NOTE — ED Provider Notes (Signed)
  9:07 AM Received signout at the beginning of shift, please see previous providers notes for complete H&P.  Patient admits to alcohol use last night, came in appears to be intoxicated and altered.  At this time, he is clinically sober, ambulate without difficulty, tolerates p.o. and felt stable to be discharged to his mom who will bring him home.  UDS and urinalysis is currently pending however patient does not want to wait for the results.  He is stable for discharge.  BP 125/73 (BP Location: Right Arm)   Pulse 64   Temp (!) 97.5 F (36.4 C) (Oral)   Resp 13   SpO2 99%   Results for orders placed or performed during the hospital encounter of 11/12/19  CBC  Result Value Ref Range   WBC 7.8 4.0 - 10.5 K/uL   RBC 5.51 4.22 - 5.81 MIL/uL   Hemoglobin 15.1 13.0 - 17.0 g/dL   HCT 19.6 22.2 - 97.9 %   MCV 86.2 80.0 - 100.0 fL   MCH 27.4 26.0 - 34.0 pg   MCHC 31.8 30.0 - 36.0 g/dL   RDW 89.2 11.9 - 41.7 %   Platelets 235 150 - 400 K/uL   nRBC 0.0 0.0 - 0.2 %  Comprehensive metabolic panel  Result Value Ref Range   Sodium 141 135 - 145 mmol/L   Potassium 3.8 3.5 - 5.1 mmol/L   Chloride 103 98 - 111 mmol/L   CO2 25 22 - 32 mmol/L   Glucose, Bld 90 70 - 99 mg/dL   BUN 15 6 - 20 mg/dL   Creatinine, Ser 4.08 0.61 - 1.24 mg/dL   Calcium 9.4 8.9 - 14.4 mg/dL   Total Protein 7.8 6.5 - 8.1 g/dL   Albumin 5.0 3.5 - 5.0 g/dL   AST 32 15 - 41 U/L   ALT 17 0 - 44 U/L   Alkaline Phosphatase 54 38 - 126 U/L   Total Bilirubin 0.8 0.3 - 1.2 mg/dL   GFR calc non Af Amer >60 >60 mL/min   GFR calc Af Amer >60 >60 mL/min   Anion gap 13 5 - 15  Ethanol  Result Value Ref Range   Alcohol, Ethyl (B) 87 (H) <10 mg/dL   No results found.    Fayrene Helper, PA-C 11/12/19 8185    Derwood Kaplan, MD 11/18/19 [phone]

## 2020-01-27 ENCOUNTER — Ambulatory Visit: Payer: Self-pay | Admitting: Family Medicine

## 2020-01-27 ENCOUNTER — Encounter: Payer: Self-pay | Admitting: Family Medicine

## 2020-01-27 ENCOUNTER — Other Ambulatory Visit: Payer: Self-pay

## 2020-01-27 DIAGNOSIS — Z113 Encounter for screening for infections with a predominantly sexual mode of transmission: Secondary | ICD-10-CM

## 2020-01-27 DIAGNOSIS — Z202 Contact with and (suspected) exposure to infections with a predominantly sexual mode of transmission: Secondary | ICD-10-CM

## 2020-01-27 MED ORDER — AZITHROMYCIN 500 MG PO TABS: 1000.0000 mg | ORAL_TABLET | Freq: Once | ORAL | Status: DC

## 2020-01-27 MED ORDER — AZITHROMYCIN 500 MG PO TABS
1000.0000 mg | ORAL_TABLET | Freq: Once | ORAL | Status: AC
  Administered 2020-01-27: 1000 mg via ORAL

## 2020-01-27 NOTE — Progress Notes (Signed)
   [facility] Department STI clinic/screening visit  Subjective:  Jerry Rose is a 24 y.o. male being seen today for an STI screening visit. The patient reports they do have symptoms.    Patient has the following medical conditions:  There are no problems to display for this patient.    No chief complaint on file.   HPI  Patient reports that he has had 2 days of light yellow disch and dysuria.  States that his partner tested + for chlamydia.   See flowsheet for further details and programmatic requirements.    The following portions of the patient's history were reviewed and updated as appropriate: allergies, current medications, past medical history, past social history, past surgical history and problem list.  Objective:  There were no vitals filed for this visit.  Physical Exam Client declines exam and testing   Assessment and Plan:  Jerry Rose is a 24 y.o. male presenting to the [facility] Department for STI screening  1. Screening examination for venereal disease  2. Exposure to STD - azithromycin (ZITHROMAX) tablet 1,000 mg Co. No sexual activity x 1 week   Co. To use condoms for STD prevention.,  No follow-ups on file.  No future appointments.  Larene Pickett, FNP

## 2020-01-27 NOTE — Progress Notes (Signed)
Pt treated as a contact to Chlamydia per Larene Pickett, FNP order and verbal order. Provider orders completed.

## 2020-02-09 ENCOUNTER — Emergency Department
Admission: EM | Admit: 2020-02-09 | Discharge: 2020-02-09 | Disposition: A | Discharge status: Home / Self Care | Payer: Self-pay | Attending: Emergency Medicine | Admitting: Emergency Medicine

## 2020-02-09 ENCOUNTER — Emergency Department: Payer: Self-pay

## 2020-02-09 ENCOUNTER — Other Ambulatory Visit: Payer: Self-pay

## 2020-02-09 DIAGNOSIS — Y998 Other external cause status: Secondary | ICD-10-CM | POA: Insufficient documentation

## 2020-02-09 DIAGNOSIS — Y9389 Activity, other specified: Secondary | ICD-10-CM | POA: Insufficient documentation

## 2020-02-09 DIAGNOSIS — W228XXA Striking against or struck by other objects, initial encounter: Secondary | ICD-10-CM | POA: Insufficient documentation

## 2020-02-09 DIAGNOSIS — Y9289 Other specified places as the place of occurrence of the external cause: Secondary | ICD-10-CM | POA: Insufficient documentation

## 2020-02-09 DIAGNOSIS — F1210 Cannabis abuse, uncomplicated: Secondary | ICD-10-CM | POA: Insufficient documentation

## 2020-02-09 DIAGNOSIS — S61012A Laceration without foreign body of left thumb without damage to nail, initial encounter: Secondary | ICD-10-CM | POA: Insufficient documentation

## 2020-02-09 DIAGNOSIS — F17290 Nicotine dependence, other tobacco product, uncomplicated: Secondary | ICD-10-CM | POA: Insufficient documentation

## 2020-02-09 DIAGNOSIS — S60112A Contusion of left thumb with damage to nail, initial encounter: Secondary | ICD-10-CM | POA: Insufficient documentation

## 2020-02-09 MED ORDER — IBUPROFEN 600 MG PO TABS
600.0000 mg | ORAL_TABLET | Freq: Once | ORAL | Status: AC
  Administered 2020-02-09: 600 mg via ORAL
  Filled 2020-02-09: qty 1

## 2020-02-09 MED ORDER — IBUPROFEN 600 MG PO TABS: 600.0000 mg | ORAL_TABLET | Freq: Three times a day (TID) | ORAL | 0 refills | Status: DC | PRN

## 2020-02-09 MED ORDER — TRAMADOL HCL 50 MG PO TABS: 50.0000 mg | ORAL_TABLET | Freq: Four times a day (QID) | ORAL | 0 refills | Status: DC | PRN

## 2020-02-09 NOTE — Discharge Instructions (Signed)
Follow discharge care instructions and wear splint for 3 to 5 days as needed.

## 2020-02-09 NOTE — ED Triage Notes (Signed)
Patient struck left thumb with hammer. Laceration seen to finger tip/pad.

## 2020-02-09 NOTE — ED Notes (Signed)
See triage note  Presents with injury to left thumb  States he hit his thumb with a hammer last pm

## 2020-02-09 NOTE — ED Provider Notes (Signed)
[facility] Emergency Department Provider Note   ____________________________________________   First MD Initiated Contact with Patient 02/09/20 [phone]     (approximate)  I have reviewed the triage vital signs and the nursing notes.   HISTORY  Chief Complaint Hand Pain (left thumb)    HPI Jerry Rose is a 24 y.o. male patient presents with contusion laceration dorsal aspect of left thumb.  Patient denies loss sensation or loss of function.  Patient rates pain as 9/10.  Patient described pain as "achy".  No palliative measures prior to arrival.         History reviewed. No pertinent past medical history.  There are no problems to display for this patient.   History reviewed. No pertinent surgical history.  Prior to Admission medications   Medication Sig Start Date End Date Taking? Authorizing Provider  ibuprofen (ADVIL) 600 MG tablet Take 1 tablet (600 mg total) by mouth every 8 (eight) hours as needed. 02/09/20   Joni Reining, PA-C  ibuprofen (ADVIL) 800 MG tablet Take 1 tablet (800 mg total) by mouth 3 (three) times daily. 02/17/19   Roxy Horseman, PA-C  traMADol (ULTRAM) 50 MG tablet Take 1 tablet (50 mg total) by mouth every 6 (six) hours as needed for moderate pain. 02/09/20   Joni Reining, PA-C    Allergies Penicillins and Rocephin [ceftriaxone sodium in dextrose]  No family history on file.  Social History Social History   Tobacco Use  . Smoking status: Light Tobacco Smoker    Types: E-cigarettes  . Smokeless tobacco: Never Used  Substance Use Topics  . Alcohol use: Yes  . Drug use: Yes    Types: Marijuana    Review of Systems  Constitutional: No fever/chills Eyes: No visual changes. ENT: No sore throat. Cardiovascular: Denies chest pain. Respiratory: Denies shortness of breath. Gastrointestinal: No abdominal pain.  No nausea, no vomiting.  No diarrhea.  No constipation. Genitourinary: Negative for  dysuria. Musculoskeletal: Left thumb pain.   Skin: Negative for rash.  Laceration dorsal aspect of left thumb. Neurological: Negative for headaches, focal weakness or numbness. Allergic/Immunilogical: Penicillin and Rocephin. ____________________________________________   PHYSICAL EXAM:  VITAL SIGNS: ED Triage Vitals  Enc Vitals Group     BP 02/09/20 0650 132/79     Pulse Rate 02/09/20 0650 60     Resp 02/09/20 0650 18     Temp 02/09/20 0650 98 F (36.7 C)     Temp src --      SpO2 --      Weight 02/09/20 0707 149 lb 14.6 oz (68 kg)     Height 02/09/20 0707 5\' 10"  (1.778 m)     Head Circumference --      Peak Flow --      Pain Score 02/09/20 0648 9     Pain Loc --      Pain Edu? --      Excl. in GC? --     Constitutional: Alert and oriented. Well appearing and in no acute distress. Cardiovascular: Normal rate, regular rhythm. Grossly normal heart sounds.  Good peripheral circulation. Respiratory: Normal respiratory effort.  No retractions. Lungs CTAB. Musculoskeletal: No obvious deformity to the left thumb.  Neurologic:  Normal speech and language. No gross focal neurologic deficits are appreciated. No gait instability. Skin: 0.3 cm laceration dorsal aspect left thumb. Psychiatric: Mood and affect are normal. Speech and behavior are normal.  ____________________________________________   LABS (all labs ordered are listed, but only  abnormal results are displayed)  Labs Reviewed - No data to display ____________________________________________  EKG   ____________________________________________  RADIOLOGY  ED MD interpretation:    Official radiology report(s): DG Finger Thumb Left  Result Date: 02/09/2020 CLINICAL DATA:  Posttraumatic thumb pain EXAM: LEFT THUMB 2+V COMPARISON:  07/26/2018 hand radiograph FINDINGS: There is no evidence of fracture or dislocation. There is no evidence of arthropathy or other focal bone abnormality. Soft tissues are unremarkable.  IMPRESSION: Negative. Electronically Signed   By: Marnee Spring M.D.   On: 02/09/2020 07:28    ____________________________________________   PROCEDURES  Procedure(s) performed (including Critical Care):  Marland KitchenMarland KitchenLaceration Repair  Date/Time: 02/09/2020 8:56 AM Performed by: Beau Fanny, Student-PA Authorized by: Joni Reining, PA-C   Consent:    Consent obtained:  Verbal   Consent given by:  Patient   Risks discussed:  Infection, pain and poor cosmetic result Anesthesia (see MAR for exact dosages):    Anesthesia method:  None Laceration details:    Location:  Finger   Finger location:  L thumb   Length (cm):  0.3   Depth (mm):  1 Repair type:    Repair type:  Simple Treatment:    Area cleansed with:  Betadine and saline   Amount of cleaning:  Standard Skin repair:    Repair method:  Tissue adhesive Approximation:    Approximation:  Close Post-procedure details:    Dressing:  Sterile dressing and splint for protection   Patient tolerance of procedure:  Tolerated well, no immediate complications     ____________________________________________   INITIAL IMPRESSION / ASSESSMENT AND PLAN / ED COURSE  As part of my medical decision making, I reviewed the following data within the electronic MEDICAL RECORD NUMBER     Patient presents with contusion and laceration dorsal aspect of left thumb.  Discussed negative x-ray findings with patient.  See procedure note for wound closure.  Patient given discharge care instruction work note.          ____________________________________________   FINAL CLINICAL IMPRESSION(S) / ED DIAGNOSES  Final diagnoses:  Contusion of left thumb nail, initial encounter  Laceration of left thumb without foreign body without damage to nail, initial encounter     ED Discharge Orders         Ordered    traMADol (ULTRAM) 50 MG tablet  Every 6 hours PRN     Discontinue  Reprint     02/09/20 0800    ibuprofen (ADVIL) 600 MG tablet   Every 8 hours PRN     Discontinue  Reprint     02/09/20 0800           Note:  This document was prepared using Dragon voice recognition software and may include unintentional dictation errors.    Joni Reining, PA-C 02/09/20 1026    Sharman Cheek, MD 02/09/20 1500

## 2020-02-09 NOTE — ED Provider Notes (Signed)
[facility] Emergency Department Provider Note   ____________________________________________   First MD Initiated Contact with Patient 02/09/20 [phone]     (approximate)  I have reviewed the triage vital signs and the nursing notes.   HISTORY  Chief Complaint Hand Pain (left thumb)    HPI Jerry Rose is a 24 y.o. male patient complain left thumb pain secondary to being struck by a hammer.  Patient also sustained a laceration to the distal left thumb.  Bleeding controlled with direct pressure.  Patient denies loss sensation or loss of function.  Patient rates pain as a 9/10.  Patient's current pain is "aching".  No palliative measures prior to arrival.         History reviewed. No pertinent past medical history.  There are no problems to display for this patient.   History reviewed. No pertinent surgical history.  Prior to Admission medications   Medication Sig Start Date End Date Taking? Authorizing Provider  ibuprofen (ADVIL) 600 MG tablet Take 1 tablet (600 mg total) by mouth every 8 (eight) hours as needed. 02/09/20   Joni Reining, PA-C  ibuprofen (ADVIL) 800 MG tablet Take 1 tablet (800 mg total) by mouth 3 (three) times daily. 02/17/19   Roxy Horseman, PA-C  traMADol (ULTRAM) 50 MG tablet Take 1 tablet (50 mg total) by mouth every 6 (six) hours as needed for moderate pain. 02/09/20   Joni Reining, PA-C    Allergies Penicillins and Rocephin [ceftriaxone sodium in dextrose]  No family history on file.  Social History Social History   Tobacco Use  . Smoking status: Light Tobacco Smoker    Types: E-cigarettes  . Smokeless tobacco: Never Used  Substance Use Topics  . Alcohol use: Yes  . Drug use: Yes    Types: Marijuana    Review of Systems Constitutional: No fever/chills Eyes: No visual changes. ENT: No sore throat. Cardiovascular: Denies chest pain. Respiratory: Denies shortness of breath. Gastrointestinal: No abdominal  pain.  No nausea, no vomiting.  No diarrhea.  No constipation. Genitourinary: Negative for dysuria. Musculoskeletal: Left arm pain.   Skin: Negative for rash. Neurological: Negative for headaches, focal weakness or numbness. Allergic/Immunilogical: Penicillin and Rocephin. ____________________________________________   PHYSICAL EXAM:  VITAL SIGNS: ED Triage Vitals  Enc Vitals Group     BP 02/09/20 0650 132/79     Pulse Rate 02/09/20 0650 60     Resp 02/09/20 0650 18     Temp 02/09/20 0650 98 F (36.7 C)     Temp src --      SpO2 --      Weight 02/09/20 0707 149 lb 14.6 oz (68 kg)     Height 02/09/20 0707 5\' 10"  (1.778 m)     Head Circumference --      Peak Flow --      Pain Score 02/09/20 0648 9     Pain Loc --      Pain Edu? --      Excl. in GC? --     Constitutional: Alert and oriented. Well appearing and in no acute distress. Cardiovascular: Normal rate, regular rhythm. Grossly normal heart sounds.  Good peripheral circulation. Respiratory: Normal respiratory effort.  No retractions. Lungs CTAB. Musculoskeletal: No obvious deformity to the left stump.  Patient is moderate guarding palpation distal phalange.  Neurologic:  Normal speech and language. No gross focal neurologic deficits are appreciated. No gait instability. Skin: 0.3 cm laceration dorsal aspect of left thumb. Psychiatric: Mood  and affect are normal. Speech and behavior are normal.  ____________________________________________   LABS (all labs ordered are listed, but only abnormal results are displayed)  Labs Reviewed - No data to display ____________________________________________  EKG   ____________________________________________  RADIOLOGY  ED MD interpretation:    Official radiology report(s): DG Finger Thumb Left  Result Date: 02/09/2020 CLINICAL DATA:  Posttraumatic thumb pain EXAM: LEFT THUMB 2+V COMPARISON:  07/26/2018 hand radiograph FINDINGS: There is no evidence of fracture or  dislocation. There is no evidence of arthropathy or other focal bone abnormality. Soft tissues are unremarkable. IMPRESSION: Negative. Electronically Signed   By: Marnee Spring M.D.   On: 02/09/2020 07:28    ____________________________________________   PROCEDURES  Procedure(s) performed (including Critical Care):  Procedures   ____________________________________________   INITIAL IMPRESSION / ASSESSMENT AND PLAN / ED COURSE  As part of my medical decision making, I reviewed the following data within the electronic MEDICAL RECORD NUMBER     Patient presents with contusion and laceration to left thumb.  Discussed x-ray findings with patient.  See procedure note for wound closure.  Patient given discharge care instructions and advised to wear splint for 3 to 5 days.   Jerry Rose was evaluated in Emergency Department on 02/09/2020 for the symptoms described in the history of present illness. He was evaluated in the context of the global COVID-19 pandemic, which necessitated consideration that the patient might be at risk for infection with the SARS-CoV-2 virus that causes COVID-19. Institutional protocols and algorithms that pertain to the evaluation of patients at risk for COVID-19 are in a state of rapid change based on information released by regulatory bodies including the CDC and federal and state organizations. These policies and algorithms were followed during the patient's care in the ED.       ____________________________________________   FINAL CLINICAL IMPRESSION(S) / ED DIAGNOSES  Final diagnoses:  Contusion of left thumb nail, initial encounter  Laceration of left thumb without foreign body without damage to nail, initial encounter     ED Discharge Orders         Ordered    traMADol (ULTRAM) 50 MG tablet  Every 6 hours PRN     Discontinue  Reprint     02/09/20 0800    ibuprofen (ADVIL) 600 MG tablet  Every 8 hours PRN     Discontinue  Reprint     02/09/20 0800            Note:  This document was prepared using Dragon voice recognition software and may include unintentional dictation errors.    Joni Reining, PA-C 02/09/20 1021    Sharman Cheek, MD 02/09/20 1500

## 2020-02-23 ENCOUNTER — Telehealth: Payer: Self-pay | Admitting: General Practice

## 2020-02-23 NOTE — Telephone Encounter (Signed)
Individual has been contacted 3+ times regarding ED referral. No further attempts to contact individual will be made. 

## 2020-04-18 ENCOUNTER — Ambulatory Visit: Payer: Self-pay | Admitting: Physician Assistant

## 2020-04-18 ENCOUNTER — Other Ambulatory Visit: Payer: Self-pay

## 2020-04-18 DIAGNOSIS — Z202 Contact with and (suspected) exposure to infections with a predominantly sexual mode of transmission: Secondary | ICD-10-CM

## 2020-04-18 DIAGNOSIS — Z113 Encounter for screening for infections with a predominantly sexual mode of transmission: Secondary | ICD-10-CM

## 2020-04-18 MED ORDER — METRONIDAZOLE 500 MG PO TABS: 500.0000 mg | ORAL_TABLET | Freq: Two times a day (BID) | ORAL | 0 refills | Status: AC

## 2020-04-20 ENCOUNTER — Encounter: Payer: Self-pay | Admitting: Physician Assistant

## 2020-04-20 NOTE — Progress Notes (Signed)
   [facility] Department STI clinic/screening visit  Subjective:  Jerry Rose is a 24 y.o. male being seen today for an STI screening visit. The patient reports they do have symptoms.    Patient has the following medical conditions:  There are no problems to display for this patient.    Chief Complaint  Patient presents with  . SEXUALLY TRANSMITTED DISEASE    screening    HPI  Patient reports that he is a contact to Trich and has symptoms of itching inside the penis. Denies other symptoms. Reports last HIV test was in 09/2019.     See flowsheet for further details and programmatic requirements.    The following portions of the patient's history were reviewed and updated as appropriate: allergies, current medications, past medical history, past social history, past surgical history and problem list.  Objective:  There were no vitals filed for this visit.  Physical Exam Constitutional:      General: He is not in acute distress.    Appearance: Normal appearance.  HENT:     Head: Normocephalic and atraumatic.  Eyes:     Conjunctiva/sclera: Conjunctivae normal.  Pulmonary:     Effort: Pulmonary effort is normal.  Skin:    General: Skin is warm and dry.  Neurological:     Mental Status: He is alert and oriented to person, place, and time.  Psychiatric:        Mood and Affect: Mood normal.        Behavior: Behavior normal.        Thought Content: Thought content normal.        Judgment: Judgment normal.       Assessment and Plan:  Jerry Rose is a 24 y.o. male presenting to the [facility] Department for STI screening  1. Screening for STD (sexually transmitted disease) Patient into clinic with symptoms. Patient declines screening exam and blood work today.  Requests treatment only.  Rec condoms with all sex.  2. Venereal disease contact Will treat as a contact to Trich with Metronidazole 500 mg #14 1 po BID for 7 days with food, no  EtOH for 24 hr before and until 72 hr after completing medicine. No sex for 7 days and until after partner completes treatment. - metroNIDAZOLE (FLAGYL) 500 MG tablet; Take 1 tablet (500 mg total) by mouth 2 (two) times daily for 7 days.  Dispense: 14 tablet; Refill: 0     No follow-ups on file.  No future appointments.  Matt Holmes, PA

## 2020-04-23 NOTE — Progress Notes (Signed)
Chart reviewed by Pharmacist  Suzanne Walker PharmD, Contract Pharmacist at [facility]  

## 2020-12-05 IMAGING — DX DG FINGER THUMB 2+V*L*
3 series · 3 of 3 positions shown · non-contrast
Comparison: 07/26/2018 hand radiograph

CLINICAL DATA: Posttraumatic thumb pain

EXAM:
LEFT THUMB 2+V

[finger ap]
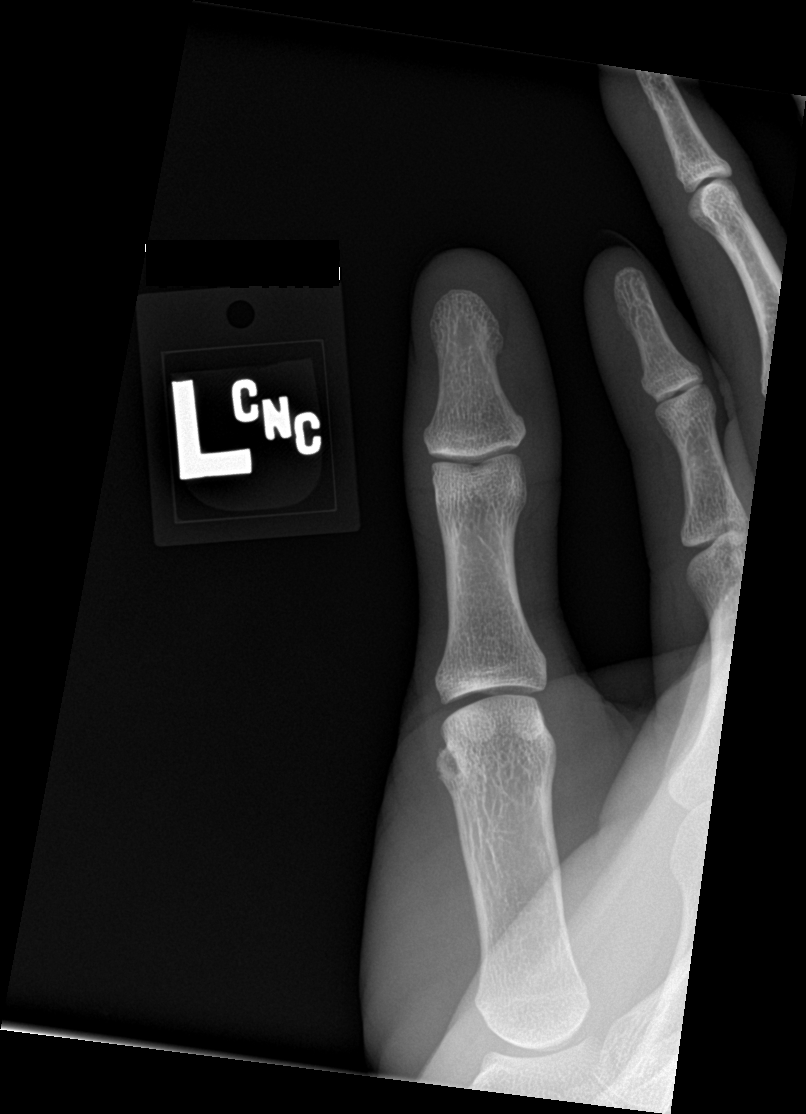

[finger obl]
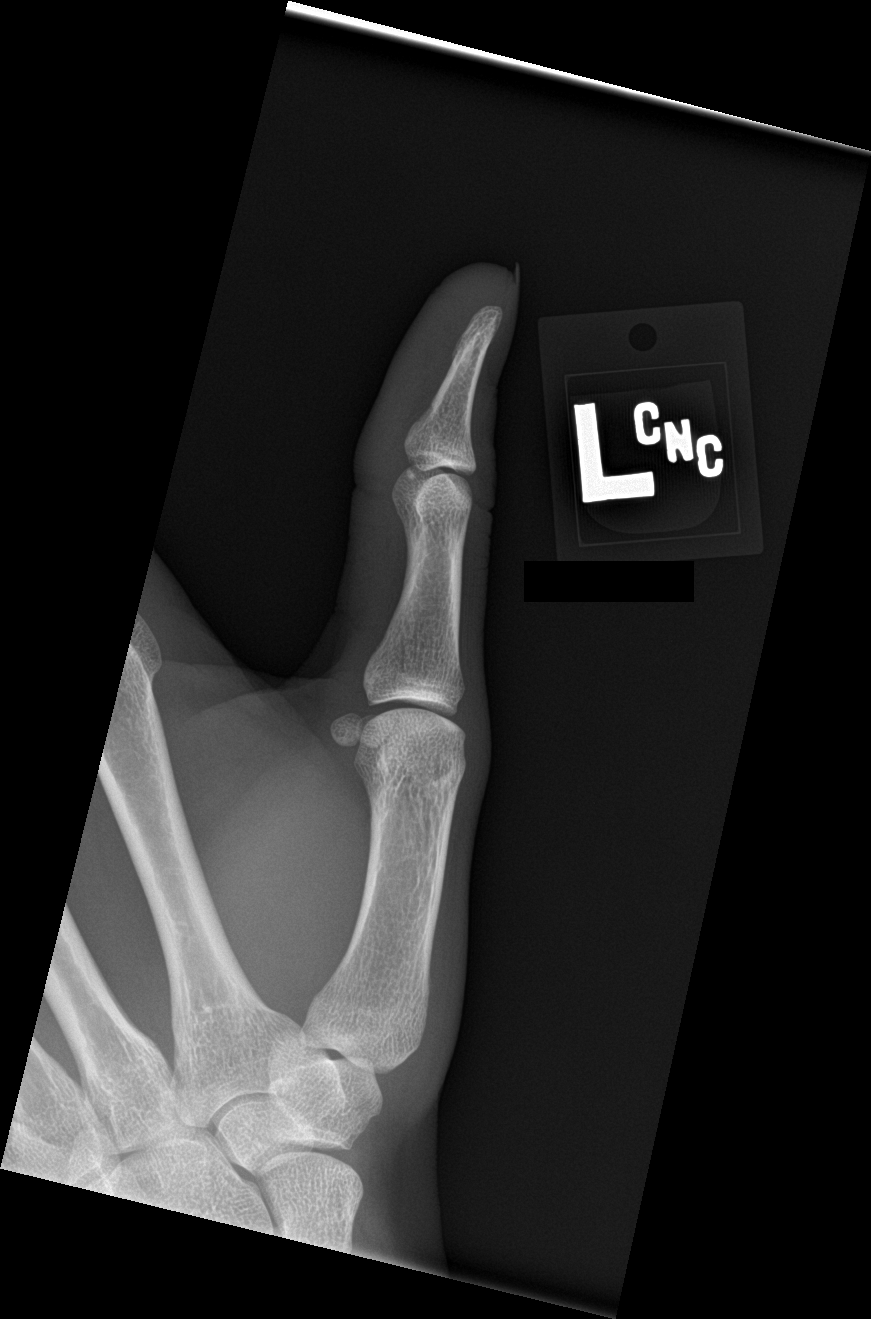

[finger lat]
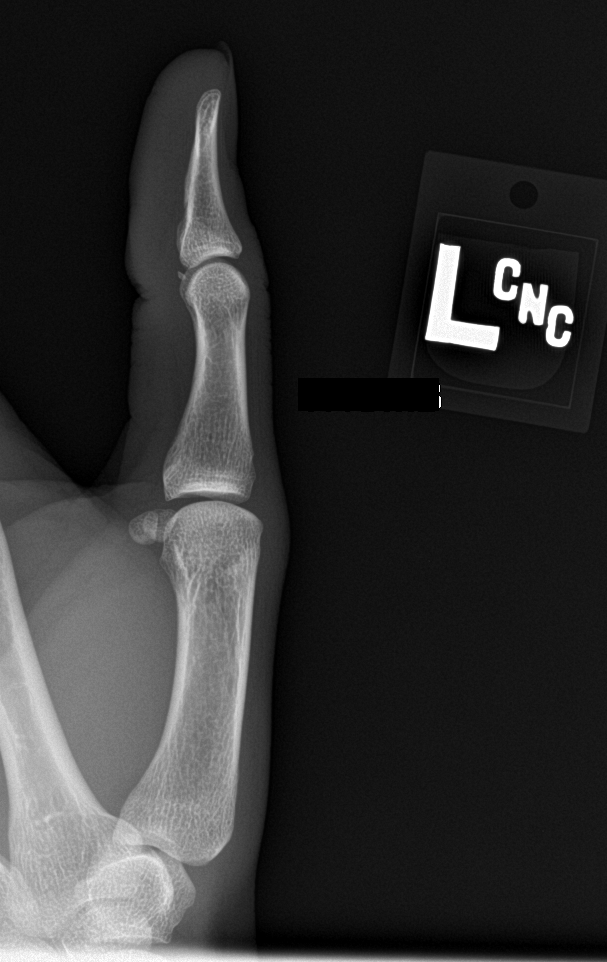

[3 of 3 positions shown; findings below may reference images not displayed]

FINDINGS: There is no evidence of fracture or dislocation. There is no
evidence of arthropathy or other focal bone abnormality. Soft
tissues are unremarkable.
IMPRESSION: Negative.

## 2021-01-26 ENCOUNTER — Other Ambulatory Visit: Payer: Self-pay

## 2021-01-26 ENCOUNTER — Ambulatory Visit: Payer: Self-pay | Admitting: Physician Assistant

## 2021-01-26 DIAGNOSIS — Z113 Encounter for screening for infections with a predominantly sexual mode of transmission: Secondary | ICD-10-CM

## 2021-01-26 DIAGNOSIS — Z202 Contact with and (suspected) exposure to infections with a predominantly sexual mode of transmission: Secondary | ICD-10-CM

## 2021-01-26 MED ORDER — GENTAMICIN SULFATE 40 MG/ML IJ SOLN
240.0000 mg | Freq: Once | INTRAMUSCULAR | Status: AC
  Administered 2021-01-26: 240 mg via INTRAMUSCULAR

## 2021-01-26 MED ORDER — AZITHROMYCIN 500 MG PO TABS
2000.0000 mg | ORAL_TABLET | Freq: Once | ORAL | Status: AC
  Administered 2021-01-26: 2000 mg via ORAL

## 2021-01-26 NOTE — Progress Notes (Signed)
Pt here to be treated as a contact to Gonorrhea.  Pt does not want any STD testing done today.  Medication dispensed and Gentamicin 240 mg given IM, without any complications, per Provider orders.  Pt given condoms. Berdie Ogren, RN

## 2021-01-28 ENCOUNTER — Encounter: Payer: Self-pay | Admitting: Physician Assistant

## 2021-01-28 NOTE — Progress Notes (Signed)
   [facility] Department STI clinic/screening visit  Subjective:  Jerry Rose is a 25 y.o. male being seen today for an STI screening visit. The patient reports they do not have symptoms.    Patient has the following medical conditions:  There are no problems to display for this patient.    Chief Complaint  Patient presents with   SEXUALLY TRANSMITTED DISEASE    Treatment for Gonorrhea    HPI  Patient reports that he is not having any symptoms but is a contact to [employer].  Denies chronic conditions, surgeries and regular medicines.  Reports last HIV test was in February of this year.    See flowsheet for further details and programmatic requirements.    The following portions of the patient's history were reviewed and updated as appropriate: allergies, current medications, past medical history, past social history, past surgical history and problem list.  Objective:  There were no vitals filed for this visit.  Physical Exam Constitutional:      General: He is not in acute distress.    Appearance: Normal appearance.  HENT:     Head: Normocephalic and atraumatic.  Eyes:     Conjunctiva/sclera: Conjunctivae normal.  Pulmonary:     Effort: Pulmonary effort is normal.  Skin:    General: Skin is warm and dry.  Neurological:     Mental Status: He is alert and oriented to person, place, and time.  Psychiatric:        Mood and Affect: Mood normal.        Behavior: Behavior normal.        Thought Content: Thought content normal.        Judgment: Judgment normal.      Assessment and Plan:  Jerry Rose is a 25 y.o. male presenting to the [facility] Department for STI screening  1. Screening for STD (sexually transmitted disease) Patient into clinic without symptoms. Patient declines screening exam/testing and blood work today.  Requests treatment as a contact only.  Rec condoms with all sex.  2. Gonorrhea contact Treat as a contact to GC with  Gentamicin 240 mg IM and Azithromycin 2 g po DOT. No sex for 7 days and until after  partner completes treatment. Call with questions or concerns. - gentamicin (GARAMYCIN) injection 240 mg - azithromycin (ZITHROMAX) tablet 2,000 mg     No follow-ups on file.  No future appointments.  Matt Holmes, PA

## 2021-10-27 ENCOUNTER — Ambulatory Visit: Payer: Self-pay | Admitting: Nurse Practitioner

## 2021-10-27 ENCOUNTER — Encounter: Payer: Self-pay | Admitting: Nurse Practitioner

## 2021-10-27 ENCOUNTER — Other Ambulatory Visit: Payer: Self-pay

## 2021-10-27 DIAGNOSIS — Z202 Contact with and (suspected) exposure to infections with a predominantly sexual mode of transmission: Secondary | ICD-10-CM

## 2021-10-27 DIAGNOSIS — Z113 Encounter for screening for infections with a predominantly sexual mode of transmission: Secondary | ICD-10-CM

## 2021-10-27 MED ORDER — DOXYCYCLINE HYCLATE 100 MG PO TABS: 100.0000 mg | ORAL_TABLET | Freq: Two times a day (BID) | ORAL | 0 refills | Status: DC

## 2021-10-27 NOTE — Progress Notes (Signed)
Pt here as a contact to Chlamydia.  Pt declines testing.  Medication dispensed per Provider orders.  Berdie Ogren, RN ? ?

## 2021-10-27 NOTE — Progress Notes (Signed)
[facility] Department ?STI clinic/screening visit ? ?Subjective:  ?Jerry Rose is a 26 y.o. male being seen today for an STI screening visit. The patient reports they do have symptoms.   ? ?Patient has the following medical conditions:  There are no problems to display for this patient. ? ? ? ?Chief Complaint  ?Patient presents with  ? SEXUALLY TRANSMITTED DISEASE  ? ? ?HPI ? ?Patient reports to clinic today as a contact to Chlamydia.  Patient reports burning when he urinates that started 1-2 weeks ago.  Patient states that his girlfriend tested positive for Chlamydia and was also tested around the same time as his girlfriend.   ? ?Does the patient or their partner desires a pregnancy in the next year? No ? ?Screening for MPX risk: ?Does the patient have an unexplained rash? No ?Is the patient MSM? No ?Does the patient endorse multiple sex partners or anonymous sex partners? No ?Did the patient have close or sexual contact with a person diagnosed with MPX? No ?Has the patient traveled outside the [place] where MPX is endemic? No ?Is there a high clinical suspicion for MPX-- evidenced by one of the following No ? -Unlikely to be chickenpox ? -Lymphadenopathy ? -Rash that present in same phase of evolution on any given body part ? ? ?See flowsheet for further details and programmatic requirements.  ? ? ?The following portions of the patient's history were reviewed and updated as appropriate: allergies, current medications, past medical history, past social history, past surgical history and problem list. ? ?Objective:  ?There were no vitals filed for this visit. ? ?Physical Exam ?Constitutional:   ?   Appearance: Normal appearance.  ?HENT:  ?   Head: Normocephalic.  ?   Right Ear: External ear normal.  ?   Left Ear: External ear normal.  ?   Nose: Nose normal.  ?   Mouth/Throat:  ?   Mouth: Mucous membranes are moist.  ?   Comments: No visible signs of dental caries.  Missing teeth noted.  ?Pulmonary:  ?    Effort: Pulmonary effort is normal.  ?Abdominal:  ?   General: Abdomen is flat.  ?   Palpations: Abdomen is soft.  ?Genitourinary: ?   Comments: Deferred patient declined genital exam.  ?Musculoskeletal:  ?   Cervical back: Full passive range of motion without pain, normal range of motion and neck supple.  ?Skin: ?   General: Skin is warm and dry.  ?Neurological:  ?   Mental Status: He is alert and oriented to person, place, and time.  ?Psychiatric:     ?   Attention and Perception: Attention normal.     ?   Mood and Affect: Mood normal.     ?   Speech: Speech normal.     ?   Behavior: Behavior is cooperative.  ? ? ? ? ?Assessment and Plan:  ?Jerry Rose is a 26 y.o. male presenting to the [facility] Department for STI screening ? ?1. Screening examination for venereal disease ?-26 year old male in clinic today to be treated for Chlamydia.  Girlfriend previously tested positive for Chlamydia and reprots being tested previously through another provider.  Declines all std screenings today, would just like treatment.   ?-Patient does have STI symptoms ?Patient declines all screenings including  oral, urine, rectal CT/GC and bloodwork for HIV/RPR.  ?Patient meets criteria for HepB screening? Yes. Ordered? No - patient refuses  ?Patient meets criteria for HepC  screening? Yes. Ordered? No - patient refuses  ?Recommended condom use with all sex ?Discussed importance of condom use for STI prevent ? ? ?2. Exposure to chlamydia ?-Please treat patient as a contact to Chlamydia.   ?- doxycycline (VIBRA-TABS) 100 MG tablet; Take 1 tablet (100 mg total) by mouth 2 (two) times daily.  Dispense: 14 tablet; Refill: 0  ? ?Return if symptoms worsen or fail to improve. ? ?No future appointments. ? ?Glenna Fellows, FNP ? ?

## 2021-10-27 NOTE — Progress Notes (Signed)
Patient is allergic to Penicillin and Rocephin. Patient for medication for exposure. Providers orders completed ?

## 2022-03-20 ENCOUNTER — Encounter: Payer: Self-pay | Admitting: Nurse Practitioner

## 2022-03-20 ENCOUNTER — Ambulatory Visit: Payer: Self-pay | Admitting: Nurse Practitioner

## 2022-03-20 DIAGNOSIS — N341 Nonspecific urethritis: Secondary | ICD-10-CM

## 2022-03-20 DIAGNOSIS — Z113 Encounter for screening for infections with a predominantly sexual mode of transmission: Secondary | ICD-10-CM

## 2022-03-20 LAB — GRAM STAIN

## 2022-03-20 MED ORDER — DOXYCYCLINE HYCLATE 100 MG PO TABS: 100.0000 mg | ORAL_TABLET | Freq: Two times a day (BID) | ORAL | 0 refills | Status: AC

## 2022-03-20 NOTE — Progress Notes (Addendum)
Pt here for STI screening.  Gram stain results reviewed.  Pt positive for NGU.  Doxycycline 100mg  #14 dispensed as per SO.  Pt counseled on medication, side effects, and plan of care.  Pt verbalizes understanding and voices no concerns.  Condoms provided.- , RN

## 2022-03-20 NOTE — Progress Notes (Signed)
[facility] Department STI clinic/screening visit  Subjective:  Jerry Rose is a 26 y.o. male being seen today for an STI screening visit. The patient reports they do have symptoms.    Patient has the following medical conditions:  There are no problems to display for this patient.    Chief Complaint  Patient presents with   SEXUALLY TRANSMITTED DISEASE    Screening - patient stated he has some yellowish green discharge that started about 3 days ago     HPI  Patient reports to clinic today for STD screening.  Patient reports discharge that occurred 3 days ago.   Does the patient or their partner desires a pregnancy in the next year? No  Screening for MPX risk: Does the patient have an unexplained rash? No Is the patient MSM? No Does the patient endorse multiple sex partners or anonymous sex partners? No Did the patient have close or sexual contact with a person diagnosed with MPX? No Has the patient traveled outside the [place] where MPX is endemic? No Is there a high clinical suspicion for MPX-- evidenced by one of the following No  -Unlikely to be chickenpox  -Lymphadenopathy  -Rash that present in same phase of evolution on any given body part   See flowsheet for further details and programmatic requirements.   Immunization History  Administered Date(s) Administered   Tdap 07/26/2018     The following portions of the patient's history were reviewed and updated as appropriate: allergies, current medications, past medical history, past social history, past surgical history and problem list.  Objective:  There were no vitals filed for this visit.  Physical Exam Constitutional:      Appearance: Normal appearance.  HENT:     Head: Normocephalic. No abrasion, masses or laceration. Hair is normal.     Right Ear: External ear normal.     Left Ear: External ear normal.     Nose: Nose normal.     Mouth/Throat:     Mouth: No oral lesions.     Dentition: No  dental caries.     Pharynx: No pharyngeal swelling, oropharyngeal exudate, posterior oropharyngeal erythema or uvula swelling.     Tonsils: No tonsillar exudate or tonsillar abscesses.  Eyes:     General: Lids are normal.        Right eye: No discharge.        Left eye: No discharge.     Conjunctiva/sclera: Conjunctivae normal.     Right eye: No exudate.    Left eye: No exudate. Abdominal:     General: Abdomen is flat.     Palpations: Abdomen is soft.     Tenderness: There is no abdominal tenderness. There is no rebound.  Genitourinary:    Pubic Area: No rash or pubic lice.      Penis: Normal and circumcised. No erythema or discharge.      Testes: Normal.        Right: Mass or tenderness not present.        Left: Mass or tenderness not present.     Rectum: Normal.     Comments: Discharge amount: small Color: clear Musculoskeletal:     Cervical back: Full passive range of motion without pain, normal range of motion and neck supple.  Lymphadenopathy:     Cervical: No cervical adenopathy.     Right cervical: No superficial, deep or posterior cervical adenopathy.    Left cervical: No superficial, deep or posterior cervical adenopathy.  Upper Body:     Right upper body: No supraclavicular, axillary or epitrochlear adenopathy.     Left upper body: No supraclavicular, axillary or epitrochlear adenopathy.     Lower Body: No right inguinal adenopathy. No left inguinal adenopathy.  Skin:    General: Skin is warm and dry.     Findings: No lesion or rash.  Neurological:     Mental Status: He is alert and oriented to person, place, and time.  Psychiatric:        Attention and Perception: Attention normal.        Mood and Affect: Mood normal.        Speech: Speech normal.        Behavior: Behavior normal. Behavior is cooperative.       Assessment and Plan:  Jerry Rose is a 26 y.o. male presenting to the [facility] Department for STI screening  1. Screening  examination for venereal disease -26 year old male in clinic today for STD screening. -Patient does have STI symptoms Patient accepted all screenings including  urine CT/GC, gam stain and declines  bloodwork for HIV/RPR.  Patient meets criteria for HepB screening? Yes. Ordered? No - refused Patient meets criteria for HepC screening? Yes. Ordered? No - refused Recommended condom use with all sex Discussed importance of condom use for STI prevent  Treat gram stain per standing order Discussed time line for [facility] results and that patient will be called with positive results and encouraged patient to call if he had not heard in 2 weeks Recommended returning for continued or worsening symptoms.    - Chlamydia/GC NAA, Confirmation - Gram stain  2. NGU (nongonococcal urethritis) -Gram stain reviewed, please treat patient today for NGU.   - doxycycline (VIBRA-TABS) 100 MG tablet; Take 1 tablet (100 mg total) by mouth 2 (two) times daily for 7 days.  Dispense: 14 tablet; Refill: 0   Total time spent: 30 minutes   Return if symptoms worsen or fail to improve.    Glenna Fellows, FNP

## 2022-03-28 ENCOUNTER — Telehealth: Payer: Self-pay

## 2022-03-28 LAB — N. GONORRHOEAE NAA, CONFIRM: N. gonorrhoeae NAA, Confirm: POSITIVE — AB

## 2022-03-28 LAB — CHLAMYDIA/GC NAA, CONFIRMATION
Chlamydia trachomatis, NAA: NEGATIVE
Neisseria gonorrhoeae, NAA: POSITIVE — AB

## 2022-03-28 NOTE — Telephone Encounter (Signed)
Calling pt re positive gonorrhea result from 03/20/22 urine specimen. Pt needs tx appt. (Pt treated for NGU with Doxy at 03/20/22 appt)  Phone call to [phone]. Left message on voicemail that RN with [facility] is calling re TR. Please call Dmitriy Gair at [phone]. MyChart is inactive.

## 2022-03-29 NOTE — Telephone Encounter (Signed)
Phone call to [phone]. Left message on voicemail that RN with [facility] is calling re TR. Please call Orva Riles at [phone].

## 2022-04-02 NOTE — Telephone Encounter (Signed)
Phone call to [phone]. Left message on voicemail that RN with [facility] is calling re some additional information from last visit. Please call Leona Pressly at [phone].

## 2022-04-03 NOTE — Telephone Encounter (Signed)
Phone call to [phone]. Left message on voicemail that RN with [facility] is calling re some additional information from last visit. Please call Grey Schlauch at [phone].  Phone call to pt at alternative contact number listed in pt chart. Male answered phone and stated she would give message to pt. Male states she will ask Mr. Holan to call his doctor's office.

## 2022-04-03 NOTE — Telephone Encounter (Signed)
Received phone call back from pt from [phone]. Pt states he has been having some issues with his phone.  Pt confirmed password. Pt counseled re + GC result and need for tx. Pt states he is allergic to PCN/Ceftriaxone. Discussed alternative contacts listed in his chart. Two alternative contacts removed from demographics per pt request, and pt declined to leave any new alternative contacts/numbers.  Tx appt scheduled for 04/04/22.

## 2022-04-12 NOTE — Telephone Encounter (Signed)
Phone call to pt at [phone]. Left message stating RN with [facility] is calling to reschedule missed tx appt. Please call Melissa Tomaselli at [phone].

## 2022-04-15 ENCOUNTER — Other Ambulatory Visit: Payer: Self-pay

## 2022-04-15 ENCOUNTER — Emergency Department: Payer: Self-pay

## 2022-04-15 ENCOUNTER — Emergency Department
Admission: EM | Admit: 2022-04-15 | Discharge: 2022-04-15 | Disposition: A | Discharge status: Home / Self Care | Payer: Self-pay | Attending: Emergency Medicine | Admitting: Emergency Medicine

## 2022-04-15 ENCOUNTER — Encounter: Payer: Self-pay | Admitting: Emergency Medicine

## 2022-04-15 DIAGNOSIS — S61412A Laceration without foreign body of left hand, initial encounter: Secondary | ICD-10-CM | POA: Insufficient documentation

## 2022-04-15 DIAGNOSIS — S60222A Contusion of left hand, initial encounter: Secondary | ICD-10-CM

## 2022-04-15 MED ORDER — LIDOCAINE HCL (PF) 1 % IJ SOLN
5.0000 mL | Freq: Once | INTRAMUSCULAR | Status: AC
  Administered 2022-04-15: 5 mL
  Filled 2022-04-15: qty 5

## 2022-04-15 MED ORDER — IBUPROFEN 600 MG PO TABS
600.0000 mg | ORAL_TABLET | Freq: Once | ORAL | Status: AC
  Administered 2022-04-15: 600 mg via ORAL
  Filled 2022-04-15: qty 1

## 2022-04-15 NOTE — Discharge Instructions (Signed)
You were evaluated in the emergency department for a laceration.  Your x-ray did not show any broken bones.  Your laceration was repaired with sutures. Keep the area clean and dry.  Wash multiple times per day with soap and water.  Do not go into the ocean or swimming pool.  Return to the emergency department or your primary care physician's office in 7 days for suture removal.  Return to the emergency department for:  -- Fever > 100.86F -- Increase pain in the wound -- Increase redness and swelling -- Pus coming from the wound -- Wound bleeds more than a small amount or it does not stop -- Wound edges come apart -- Severe pain -- Weakness or numbness in the affected area  Or any other new or worsening symptoms. It was a pleasure caring for you.
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
## 2022-05-01 NOTE — Telephone Encounter (Signed)
Phone call to pt at [phone]. Left message that RN with [facility] is calling to reschedule missed tx appt. Please call Kenza Munar at [phone] or can call [facility] main number.

## 2022-05-08 NOTE — Telephone Encounter (Signed)
Phone call to pt. Left message on voicemail that RN with [facility] is calling to get tx appt rescheduled. Please call Skylor Schnapp at [phone] for tx appt or if treated somewhere else please call and provide name of clinic.  Pt is aware and has been counseled about + GC; did not keep his tx appt; and has not responded to additional phone calls about tx.

## 2023-06-08 ENCOUNTER — Other Ambulatory Visit: Payer: Self-pay

## 2023-06-08 ENCOUNTER — Emergency Department
Admission: EM | Admit: 2023-06-08 | Discharge: 2023-06-08 | Disposition: A | Discharge status: Home / Self Care | Payer: Self-pay | Attending: Emergency Medicine | Admitting: Emergency Medicine

## 2023-06-08 DIAGNOSIS — S0531XA Ocular laceration without prolapse or loss of intraocular tissue, right eye, initial encounter: Secondary | ICD-10-CM

## 2023-06-08 DIAGNOSIS — X58XXXA Exposure to other specified factors, initial encounter: Secondary | ICD-10-CM | POA: Insufficient documentation

## 2023-06-08 DIAGNOSIS — S01111A Laceration without foreign body of right eyelid and periocular area, initial encounter: Secondary | ICD-10-CM | POA: Insufficient documentation

## 2023-06-08 DIAGNOSIS — R519 Headache, unspecified: Secondary | ICD-10-CM | POA: Insufficient documentation

## 2023-06-08 MED ORDER — NEOMYCIN-POLYMYXIN-DEXAMETH 3.5-10000-0.1 OP SUSP: 1.0000 [drp] | Freq: Four times a day (QID) | OPHTHALMIC | 0 refills | Status: AC

## 2023-06-08 MED ORDER — TETRACAINE HCL 0.5 % OP SOLN
2.0000 [drp] | Freq: Once | OPHTHALMIC | Status: AC
  Administered 2023-06-08: 2 [drp] via OPHTHALMIC
  Filled 2023-06-08: qty 4

## 2023-06-08 MED ORDER — FLUORESCEIN SODIUM 1 MG OP STRP
1.0000 | ORAL_STRIP | Freq: Once | OPHTHALMIC | Status: AC
  Administered 2023-06-08: 1 via OPHTHALMIC
  Filled 2023-06-08: qty 1

## 2023-06-08 NOTE — ED Provider Notes (Signed)
[facility] Emergency Department Provider Note     Event Date/Time   First MD Initiated Contact with Patient 06/08/23 1539     (approximate)   History   Eye Injury   HPI  Jerry Rose is a 27 y.o. male with a noncontributory medical history, presents to the ED for evaluation of right eye irritation.  Patient apparently scratched his right eye on Wednesday.  He continues to endorse a foreign body sensation.  He also reports light sensitivity and intermittent headaches but no nausea, vomiting, or vision loss reported.  Physical Exam   Triage Vital Signs: ED Triage Vitals  Encounter Vitals Group     BP 06/08/23 1516 132/76     Systolic BP Percentile --      Diastolic BP Percentile --      Pulse Rate 06/08/23 1516 72     Resp 06/08/23 1516 18     Temp 06/08/23 1515 97.7 F (36.5 C)     Temp Source 06/08/23 1515 Oral     SpO2 06/08/23 1516 99 %     Weight --      Height --      Head Circumference --      Peak Flow --      Pain Score 06/08/23 1515 10     Pain Loc --      Pain Education --      Exclude from Growth Chart --     Most recent vital signs: Vitals:   06/08/23 1515 06/08/23 1516  BP:  132/76  Pulse:  72  Resp:  18  Temp: 97.7 F (36.5 C)   SpO2:  99%    General Awake, no distress. NAD HEENT NCAT. PERRL. EOMI. right eye with significant lateral injection.  Defect to the conjunctiva noted on gross exam.  Fluorescein uptake to the same area showing a 1 cm x 0.5 cm conjunctival laceration.  No hyphema is appreciated.  No calf corneal injuries noted.  No gross foreign body appreciated.  No rhinorrhea. Mucous membranes are moist.  CV:  Good peripheral perfusion.  RESP:  Normal effort.  ABD:  No distention.    ED Results / Procedures / Treatments   Labs (all labs ordered are listed, but only abnormal results are displayed) Labs Reviewed - No data to display   EKG   RADIOLOGY  No results  found.   PROCEDURES:  Critical Care performed: No  Procedures    MEDICATIONS ORDERED IN ED: Medications  fluorescein ophthalmic strip 1 strip (has no administration in time range)  tetracaine (PONTOCAINE) 0.5 % ophthalmic solution 2 drop (has no administration in time range)     IMPRESSION / MDM / ASSESSMENT AND PLAN / ED COURSE  I reviewed the triage vital signs and the nursing notes.                              Differential diagnosis includes, but is not limited to, corneal abrasion, corneal ulcer, conjunctival laceration, conjunctivitis, retained corneal foreign body, blepharitis  Patient's presentation is most consistent with acute complicated illness / injury requiring diagnostic workup.  ----------------------------------------- 4:31 PM on 06/08/2023 ----------------------------------------- S/W Dr. Melanie Crazier: He recommends Maxitrol eyedrops and follow-up in the office.  He suspects likely early granulation of the injury secondary to the delay in presentation.  Patient's diagnosis is consistent with OD conjunctival laceration. Patient will be discharged home with prescriptions for Maxitrol eyedrops.  Patient is to follow up with [facility] as discussed, as needed or otherwise directed. Patient is given ED precautions to return to the ED for any worsening or new symptoms.  FINAL CLINICAL IMPRESSION(S) / ED DIAGNOSES   Final diagnoses:  Conjunctival laceration, right, initial encounter     Rx / DC Orders   ED Discharge Orders          Ordered    neomycin-polymyxin b-dexamethasone (MAXITROL) 3.5-10000-0.1 SUSP  Every 6 hours        06/08/23 1634             Note:  This document was prepared using Dragon voice recognition software and may include unintentional dictation errors.    Lissa Hoard, PA-C 06/08/23 1642    Chesley Noon, MD 06/08/23 [phone]

## 2023-06-08 NOTE — Discharge Instructions (Addendum)
You have a conjunctival laceration on the right eye.  This defect of the clear covering of the white part of the eye has been cut.  You may experience some blood-tinged tears and some eye irritation.  Use the eyedrops as directed, every 6 hours.  Follow-up with the eye specialist as discussed.  Call the office on Monday to arrange for an appointment.

## 2023-06-08 NOTE — ED Triage Notes (Signed)
Pt here for scratch on inside of right eye from Wednesday. States it was from a finger nail/finger. Says it feels like something is inside and irritating it. Eye is red.

## 2024-06-24 ENCOUNTER — Ambulatory Visit: Payer: Self-pay

## 2024-06-24 DIAGNOSIS — A749 Chlamydial infection, unspecified: Secondary | ICD-10-CM

## 2024-06-24 MED ORDER — DOXYCYCLINE HYCLATE 100 MG PO TABS: 100.0000 mg | ORAL_TABLET | Freq: Two times a day (BID) | ORAL | Status: AC

## 2024-06-24 MED ORDER — DOXYCYCLINE HYCLATE 100 MG PO TABS: 100.0000 mg | ORAL_TABLET | Freq: Two times a day (BID) | ORAL | Status: DC

## 2024-06-24 NOTE — Progress Notes (Signed)
 Contact to Chlamydia. Patient was dispensed doxycyline 100 mg capsules 2x/day for 7 days. I provided counseling today regarding the medication, the side effects and when to call clinic. Patient was given the opportunity to ask questions for any clarifications. Questions answered. Brochure and condoms given. Wilkie Drought, RN.

## 2024-06-24 NOTE — Progress Notes (Signed)
 [facility] Department STI clinic [address], [address] [place] [address] Main phone: [phone]  STI screening visit  Subjective:  Jerry Rose is a 28 y.o. male being seen today for an STI screening visit. The patient reports they do have symptoms.    Patient has the following medical conditions:  There are no active problems to display for this patient.  Chief Complaint  Patient presents with   SEXUALLY TRANSMITTED DISEASE   HPI Patient reports testing positive for chlamydia in [place] last week. His girlfriend also has it and is currently being treated. He does report some dysuria/discharge. He does not want any tests today, only treatment.  See flowsheet for further details and programmatic requirements  Hyperlink available at the top of the signed note in blue.  Flow sheet content below:  Pregnancy Intention Screening Does the patient want to become pregnant in the next year?: No Does the patient's partner want to become pregnant in the next year?: No Would the patient like to discuss contraceptive options today?: No Counseling Patient counseled to use condoms with all sex: Condoms declined RTC in 2-3 weeks for test results: Yes Clinic will call if test results abnormal before test result appt.: Yes Test results given to patient Patient counseled to use condoms with all sex: Condoms declined  Screening for MPX risk:  Unexplained rash?  No   MSM?  No   Multiple or anonymous sex partners?  No   Any close or sexual contact with a person  diagnosed with MPX?  No   Any outside the [place]  where MPX is endemic?  No   High clinical suspicion for MPX?    -Unlikely to be chickenpox    -Lymphadenopathy    -Rash that presents in same phase of       evolution on any given body part  No   STI screening history: Last HIV test per patient/review of record was  Lab Results  Component Value Date   HMHIVSCREEN Negative - Validated 12/04/2013    No results found for: HIV  Last HEPC test per patient/review of record was No results found for: HMHEPCSCREEN No components found for: HEPC   Last HEPB test per patient/review of record was No components found for: HMHEPBSCREEN   Fertility: Does the patient or their partner desires a pregnancy in the next year? No  Immunization History  Administered Date(s) Administered   Tdap 07/26/2018    The following portions of the patient's history were reviewed and updated as appropriate: allergies, current medications, past medical history, past social history, past surgical history and problem list.  Objective:  There were no vitals filed for this visit.  Physical Exam Constitutional:      Appearance: Normal appearance.  HENT:     Head: Normocephalic.     Mouth/Throat:     Mouth: Mucous membranes are moist.  Eyes:     General: No scleral icterus.       Right eye: No discharge.        Left eye: No discharge.  Pulmonary:     Effort: Pulmonary effort is normal.  Skin:    General: Skin is warm and dry.  Neurological:     General: No focal deficit present.     Mental Status: He is alert.  Psychiatric:        Mood and Affect: Mood normal.        Behavior: Behavior normal.    Assessment and Plan:  Jerry Rose is a 28 y.o. male presenting to the [facility] Department for STI screening  1. Chlamydia (Primary)  - Tested positive in [place], his girlfriend also tested positive and is currently taking medication - Discussed finishing all medicine, no sex until he and his partner have both been treated - Declines other STI testing today - doxycycline  (VIBRA -TABS) 100 MG tablet; Take 1 tablet (100 mg total) by mouth 2 (two) times daily for 7 days.   Patient does have STI symptoms Patient accepted the following screenings: no screening, just treatment Recommended condom use with all sex Discussed importance of condom use for STI prevention Recommended  repeat testing in 3 months with positive results. Recommended returning for continued or worsening symptoms.   Return in about 3 months (around 09/24/2024).  [name], NP

## 2024-07-07 ENCOUNTER — Telehealth: Payer: Self-pay | Admitting: Family Medicine

## 2024-07-09 ENCOUNTER — Other Ambulatory Visit: Payer: Self-pay | Admitting: Family Medicine

## 2024-07-09 DIAGNOSIS — Z202 Contact with and (suspected) exposure to infections with a predominantly sexual mode of transmission: Secondary | ICD-10-CM

## 2024-07-09 MED ORDER — DOXYCYCLINE HYCLATE 100 MG PO TABS: 100.0000 mg | ORAL_TABLET | Freq: Two times a day (BID) | ORAL | Status: AC

## 2024-07-09 NOTE — Progress Notes (Signed)
 Pt was here to pick up medication today. Pt report medication fell in a machine and crushed.  Doxycycline  LOT#[id number], EXP:07/05/2025

## 2024-07-09 NOTE — Telephone Encounter (Signed)
 Pt has been called to come for replacement pills at the clinic

## 2024-07-09 NOTE — Progress Notes (Signed)
 1. Exposure to chlamydia (Primary) -treated as a contact to Chlamydia on 11/19. Pt lost meds and came to pick up medication today  - doxycycline  (VIBRA -TABS) 100 MG tablet; Take 1 tablet (100 mg total) by mouth 2 (two) times daily for 7 days.   [facility] FNP-C
